# Patient Record
Sex: Male | Born: 1970 | Hispanic: Yes | Smoking: Never smoker
Health system: Southern US, Community
[De-identification: ages and names within clinical notes are randomized; demographics above are authoritative.]

---

## 2013-10-31 ENCOUNTER — Encounter (HOSPITAL_COMMUNITY): Admission: EM | Disposition: A | Discharge status: Home / Self Care | Payer: Self-pay | Source: Home / Self Care

## 2013-10-31 ENCOUNTER — Inpatient Hospital Stay (HOSPITAL_COMMUNITY): Payer: Worker's Compensation

## 2013-10-31 ENCOUNTER — Encounter (HOSPITAL_COMMUNITY): Payer: Worker's Compensation | Admitting: Anesthesiology

## 2013-10-31 ENCOUNTER — Emergency Department (HOSPITAL_COMMUNITY): Payer: Worker's Compensation

## 2013-10-31 ENCOUNTER — Encounter (HOSPITAL_COMMUNITY): Payer: Self-pay | Admitting: Emergency Medicine

## 2013-10-31 ENCOUNTER — Inpatient Hospital Stay (HOSPITAL_COMMUNITY): Payer: Worker's Compensation | Admitting: Anesthesiology

## 2013-10-31 ENCOUNTER — Inpatient Hospital Stay (HOSPITAL_COMMUNITY)
Admission: EM | Admit: 2013-10-31 | Discharge: 2013-11-06 | DRG: 957 | Disposition: A | Discharge status: Home / Self Care | Payer: Worker's Compensation | Attending: General Surgery | Admitting: General Surgery

## 2013-10-31 DIAGNOSIS — G960 Cerebrospinal fluid leak: Secondary | ICD-10-CM

## 2013-10-31 DIAGNOSIS — S0292XA Unspecified fracture of facial bones, initial encounter for closed fracture: Secondary | ICD-10-CM

## 2013-10-31 DIAGNOSIS — Y9269 Other specified industrial and construction area as the place of occurrence of the external cause: Secondary | ICD-10-CM

## 2013-10-31 DIAGNOSIS — D62 Acute posthemorrhagic anemia: Secondary | ICD-10-CM | POA: Diagnosis not present

## 2013-10-31 DIAGNOSIS — S62101B Fracture of unspecified carpal bone, right wrist, initial encounter for open fracture: Secondary | ICD-10-CM | POA: Diagnosis present

## 2013-10-31 DIAGNOSIS — S52122A Displaced fracture of head of left radius, initial encounter for closed fracture: Secondary | ICD-10-CM

## 2013-10-31 DIAGNOSIS — S52123A Displaced fracture of head of unspecified radius, initial encounter for closed fracture: Secondary | ICD-10-CM | POA: Diagnosis present

## 2013-10-31 DIAGNOSIS — S52599B Other fractures of lower end of unspecified radius, initial encounter for open fracture type I or II: Secondary | ICD-10-CM | POA: Diagnosis present

## 2013-10-31 DIAGNOSIS — S37019A Minor contusion of unspecified kidney, initial encounter: Secondary | ICD-10-CM | POA: Diagnosis present

## 2013-10-31 DIAGNOSIS — S42409A Unspecified fracture of lower end of unspecified humerus, initial encounter for closed fracture: Secondary | ICD-10-CM | POA: Diagnosis present

## 2013-10-31 DIAGNOSIS — S04019A Injury of optic nerve, unspecified eye, initial encounter: Secondary | ICD-10-CM | POA: Diagnosis present

## 2013-10-31 DIAGNOSIS — G9389 Other specified disorders of brain: Secondary | ICD-10-CM | POA: Diagnosis present

## 2013-10-31 DIAGNOSIS — J9600 Acute respiratory failure, unspecified whether with hypoxia or hypercapnia: Secondary | ICD-10-CM | POA: Diagnosis not present

## 2013-10-31 DIAGNOSIS — W1789XA Other fall from one level to another, initial encounter: Secondary | ICD-10-CM | POA: Diagnosis present

## 2013-10-31 DIAGNOSIS — G9601 Cranial cerebrospinal fluid leak, spontaneous: Secondary | ICD-10-CM | POA: Diagnosis present

## 2013-10-31 DIAGNOSIS — S62109A Fracture of unspecified carpal bone, unspecified wrist, initial encounter for closed fracture: Secondary | ICD-10-CM

## 2013-10-31 DIAGNOSIS — S0280XA Fracture of other specified skull and facial bones, unspecified side, initial encounter for closed fracture: Secondary | ICD-10-CM | POA: Diagnosis present

## 2013-10-31 DIAGNOSIS — S3690XA Unspecified injury of unspecified intra-abdominal organ, initial encounter: Secondary | ICD-10-CM

## 2013-10-31 DIAGNOSIS — R58 Hemorrhage, not elsewhere classified: Secondary | ICD-10-CM

## 2013-10-31 DIAGNOSIS — H532 Diplopia: Secondary | ICD-10-CM | POA: Diagnosis present

## 2013-10-31 DIAGNOSIS — R319 Hematuria, unspecified: Secondary | ICD-10-CM | POA: Diagnosis present

## 2013-10-31 DIAGNOSIS — S139XXA Sprain of joints and ligaments of unspecified parts of neck, initial encounter: Secondary | ICD-10-CM | POA: Diagnosis present

## 2013-10-31 DIAGNOSIS — S15009A Unspecified injury of unspecified carotid artery, initial encounter: Secondary | ICD-10-CM | POA: Diagnosis present

## 2013-10-31 DIAGNOSIS — S42402A Unspecified fracture of lower end of left humerus, initial encounter for closed fracture: Secondary | ICD-10-CM | POA: Diagnosis present

## 2013-10-31 DIAGNOSIS — S060X9A Concussion with loss of consciousness of unspecified duration, initial encounter: Secondary | ICD-10-CM | POA: Diagnosis present

## 2013-10-31 DIAGNOSIS — H5460 Unqualified visual loss, one eye, unspecified: Secondary | ICD-10-CM | POA: Diagnosis present

## 2013-10-31 DIAGNOSIS — IMO0002 Reserved for concepts with insufficient information to code with codable children: Secondary | ICD-10-CM

## 2013-10-31 DIAGNOSIS — W19XXXA Unspecified fall, initial encounter: Secondary | ICD-10-CM | POA: Diagnosis present

## 2013-10-31 DIAGNOSIS — I7771 Dissection of carotid artery: Secondary | ICD-10-CM | POA: Diagnosis present

## 2013-10-31 HISTORY — PX: ORIF WRIST FRACTURE: SHX2133

## 2013-10-31 LAB — CBC
HCT: 34.1 % — ABNORMAL LOW (ref 39.0–52.0)
Hemoglobin: 12.1 g/dL — ABNORMAL LOW (ref 13.0–17.0)
MCH: 32.8 pg (ref 26.0–34.0)
MCHC: 35.5 g/dL (ref 30.0–36.0)
MCV: 92.4 fL (ref 78.0–100.0)
Platelets: 225 10*3/uL (ref 150–400)
RBC: 3.69 MIL/uL — ABNORMAL LOW (ref 4.22–5.81)
RDW: 13.6 % (ref 11.5–15.5)
WBC: 17.2 10*3/uL — ABNORMAL HIGH (ref 4.0–10.5)

## 2013-10-31 LAB — URINALYSIS, ROUTINE W REFLEX MICROSCOPIC
Bilirubin Urine: NEGATIVE
Glucose, UA: 100 mg/dL — AB
KETONES UR: 15 mg/dL — AB
Nitrite: NEGATIVE
PROTEIN: NEGATIVE mg/dL
Specific Gravity, Urine: 1.024 (ref 1.005–1.030)
Urobilinogen, UA: 0.2 mg/dL (ref 0.0–1.0)
pH: 6 (ref 5.0–8.0)

## 2013-10-31 LAB — POCT I-STAT, CHEM 8
BUN: 18 mg/dL (ref 6–23)
Calcium, Ion: 1.1 mmol/L — ABNORMAL LOW (ref 1.12–1.23)
Chloride: 106 mEq/L (ref 96–112)
Creatinine, Ser: 1 mg/dL (ref 0.50–1.35)
Glucose, Bld: 189 mg/dL — ABNORMAL HIGH (ref 70–99)
HCT: 45 % (ref 39.0–52.0)
Hemoglobin: 15.3 g/dL (ref 13.0–17.0)
Potassium: 3.2 mEq/L — ABNORMAL LOW (ref 3.7–5.3)
Sodium: 143 mEq/L (ref 137–147)
TCO2: 21 mmol/L (ref 0–100)

## 2013-10-31 LAB — COMPREHENSIVE METABOLIC PANEL
ALT: 50 U/L (ref 0–53)
AST: 39 U/L — ABNORMAL HIGH (ref 0–37)
Albumin: 3.9 g/dL (ref 3.5–5.2)
Alkaline Phosphatase: 80 U/L (ref 39–117)
BUN: 18 mg/dL (ref 6–23)
CO2: 20 mEq/L (ref 19–32)
Calcium: 8.7 mg/dL (ref 8.4–10.5)
Chloride: 102 mEq/L (ref 96–112)
Creatinine, Ser: 0.89 mg/dL (ref 0.50–1.35)
GFR calc Af Amer: >90 mL/min (ref >90)
GFR calc non Af Amer: >90 mL/min (ref >90)
Glucose, Bld: 190 mg/dL — ABNORMAL HIGH (ref 70–99)
Potassium: 3.5 mEq/L — ABNORMAL LOW (ref 3.7–5.3)
Sodium: 139 mEq/L (ref 137–147)
Total Bilirubin: 0.4 mg/dL (ref 0.3–1.2)
Total Protein: 7.1 g/dL (ref 6.0–8.3)

## 2013-10-31 LAB — CBC WITH DIFFERENTIAL/PLATELET
Basophils Absolute: 0 10*3/uL (ref 0.0–0.1)
Basophils Relative: 0 % (ref 0–1)
Eosinophils Absolute: 0.1 10*3/uL (ref 0.0–0.7)
Eosinophils Relative: 1 % (ref 0–5)
HCT: 41 % (ref 39.0–52.0)
Hemoglobin: 14.9 g/dL (ref 13.0–17.0)
Lymphocytes Relative: 38 % (ref 12–46)
Lymphs Abs: 5 10*3/uL — ABNORMAL HIGH (ref 0.7–4.0)
MCH: 32.6 pg (ref 26.0–34.0)
MCHC: 36.3 g/dL — ABNORMAL HIGH (ref 30.0–36.0)
MCV: 89.7 fL (ref 78.0–100.0)
Monocytes Absolute: 0.8 10*3/uL (ref 0.1–1.0)
Monocytes Relative: 6 % (ref 3–12)
Neutro Abs: 7.3 10*3/uL (ref 1.7–7.7)
Neutrophils Relative %: 55 % (ref 43–77)
Platelets: 264 10*3/uL (ref 150–400)
RBC: 4.57 MIL/uL (ref 4.22–5.81)
RDW: 12.9 % (ref 11.5–15.5)
WBC: 13.2 10*3/uL — ABNORMAL HIGH (ref 4.0–10.5)

## 2013-10-31 LAB — URINE MICROSCOPIC-ADD ON

## 2013-10-31 LAB — LIPASE, BLOOD: Lipase: 34 U/L (ref 11–59)

## 2013-10-31 LAB — CG4 I-STAT (LACTIC ACID): Lactic Acid, Venous: 2.97 mmol/L — ABNORMAL HIGH (ref 0.5–2.2)

## 2013-10-31 LAB — TYPE AND SCREEN
ABO/RH(D): O POS
Antibody Screen: NEGATIVE

## 2013-10-31 LAB — ABO/RH: ABO/RH(D): O POS

## 2013-10-31 LAB — AMYLASE: AMYLASE: 101 U/L (ref 0–105)

## 2013-10-31 LAB — MRSA PCR SCREENING: MRSA by PCR: NEGATIVE

## 2013-10-31 SURGERY — OPEN REDUCTION INTERNAL FIXATION (ORIF) WRIST FRACTURE
Anesthesia: General | Laterality: Right

## 2013-10-31 MED ORDER — FENTANYL CITRATE 0.05 MG/ML IJ SOLN
INTRAMUSCULAR | Status: DC | PRN
  Administered 2013-10-31 (×2): 100 ug via INTRAVENOUS
  Administered 2013-10-31: 50 ug via INTRAVENOUS

## 2013-10-31 MED ORDER — ONDANSETRON HCL 4 MG/2ML IJ SOLN
4.0000 mg | Freq: Four times a day (QID) | INTRAMUSCULAR | Status: DC | PRN
  Administered 2013-10-31 – 2013-11-01 (×2): 4 mg via INTRAVENOUS
  Filled 2013-10-31 (×2): qty 2

## 2013-10-31 MED ORDER — FENTANYL CITRATE 0.05 MG/ML IJ SOLN: 100.0000 ug | Freq: Once | INTRAMUSCULAR | Status: DC

## 2013-10-31 MED ORDER — ONDANSETRON HCL 4 MG/2ML IJ SOLN
4.0000 mg | Freq: Once | INTRAMUSCULAR | Status: AC
  Administered 2013-10-31: 4 mg via INTRAVENOUS

## 2013-10-31 MED ORDER — FENTANYL CITRATE 0.05 MG/ML IJ SOLN
INTRAMUSCULAR | Status: AC | PRN
  Administered 2013-10-31: 100 ug via INTRAVENOUS

## 2013-10-31 MED ORDER — PANTOPRAZOLE SODIUM 40 MG IV SOLR
40.0000 mg | Freq: Every day | INTRAVENOUS | Status: DC
  Filled 2013-10-31: qty 40

## 2013-10-31 MED ORDER — OXYCODONE HCL 5 MG PO TABS: 5.0000 mg | ORAL_TABLET | Freq: Once | ORAL | Status: DC | PRN

## 2013-10-31 MED ORDER — TETANUS-DIPHTH-ACELL PERTUSSIS 5-2.5-18.5 LF-MCG/0.5 IM SUSP
0.5000 mL | Freq: Once | INTRAMUSCULAR | Status: AC
  Administered 2013-10-31: 0.5 mL via INTRAMUSCULAR

## 2013-10-31 MED ORDER — CEFAZOLIN SODIUM 1-5 GM-% IV SOLN
1.0000 g | Freq: Once | INTRAVENOUS | Status: AC
  Administered 2013-10-31: 1 g via INTRAVENOUS

## 2013-10-31 MED ORDER — FENTANYL CITRATE 0.05 MG/ML IJ SOLN
INTRAMUSCULAR | Status: AC
  Filled 2013-10-31: qty 5

## 2013-10-31 MED ORDER — ONDANSETRON HCL 4 MG/2ML IJ SOLN: 4.0000 mg | Freq: Four times a day (QID) | INTRAMUSCULAR | Status: DC | PRN

## 2013-10-31 MED ORDER — MORPHINE SULFATE 4 MG/ML IJ SOLN: 6.0000 mg | INTRAMUSCULAR | Status: DC | PRN

## 2013-10-31 MED ORDER — BUPIVACAINE HCL 0.25 % IJ SOLN
INTRAMUSCULAR | Status: DC | PRN
  Administered 2013-10-31: 10 mL

## 2013-10-31 MED ORDER — PANTOPRAZOLE SODIUM 40 MG PO TBEC
40.0000 mg | DELAYED_RELEASE_TABLET | Freq: Every day | ORAL | Status: DC
  Administered 2013-11-01 – 2013-11-06 (×6): 40 mg via ORAL
  Filled 2013-10-31 (×6): qty 1

## 2013-10-31 MED ORDER — FENTANYL CITRATE 0.05 MG/ML IJ SOLN: 50.0000 ug | INTRAMUSCULAR | Status: DC | PRN

## 2013-10-31 MED ORDER — TETANUS-DIPHTH-ACELL PERTUSSIS 5-2.5-18.5 LF-MCG/0.5 IM SUSP
INTRAMUSCULAR | Status: AC
  Filled 2013-10-31: qty 0.5

## 2013-10-31 MED ORDER — HYDROMORPHONE HCL PF 1 MG/ML IJ SOLN: 0.2500 mg | INTRAMUSCULAR | Status: DC | PRN

## 2013-10-31 MED ORDER — CHLORHEXIDINE GLUCONATE CLOTH 2 % EX PADS
6.0000 | MEDICATED_PAD | Freq: Once | CUTANEOUS | Status: AC
  Administered 2013-10-31: 6 via TOPICAL

## 2013-10-31 MED ORDER — BUPIVACAINE HCL (PF) 0.25 % IJ SOLN
INTRAMUSCULAR | Status: AC
  Filled 2013-10-31: qty 30

## 2013-10-31 MED ORDER — LACTATED RINGERS IV SOLN
INTRAVENOUS | Status: DC | PRN
  Administered 2013-10-31 (×2): via INTRAVENOUS

## 2013-10-31 MED ORDER — CEFAZOLIN SODIUM-DEXTROSE 2-3 GM-% IV SOLR
2.0000 g | INTRAVENOUS | Status: DC
  Filled 2013-10-31: qty 50

## 2013-10-31 MED ORDER — SUCCINYLCHOLINE CHLORIDE 20 MG/ML IJ SOLN
INTRAMUSCULAR | Status: DC | PRN
  Administered 2013-10-31: 160 mg via INTRAVENOUS

## 2013-10-31 MED ORDER — METOCLOPRAMIDE HCL 5 MG PO TABS
5.0000 mg | ORAL_TABLET | Freq: Three times a day (TID) | ORAL | Status: DC | PRN
  Filled 2013-10-31: qty 2

## 2013-10-31 MED ORDER — ACETAMINOPHEN 500 MG PO TABS: 1000.0000 mg | ORAL_TABLET | Freq: Once | ORAL | Status: DC

## 2013-10-31 MED ORDER — ONDANSETRON HCL 4 MG PO TABS: 4.0000 mg | ORAL_TABLET | Freq: Four times a day (QID) | ORAL | Status: DC | PRN

## 2013-10-31 MED ORDER — CLINDAMYCIN PHOSPHATE 600 MG/50ML IV SOLN
600.0000 mg | Freq: Once | INTRAVENOUS | Status: AC
  Administered 2013-10-31: 600 mg via INTRAVENOUS
  Filled 2013-10-31: qty 50

## 2013-10-31 MED ORDER — CHLORHEXIDINE GLUCONATE 4 % EX LIQD
60.0000 mL | Freq: Once | CUTANEOUS | Status: DC
  Filled 2013-10-31: qty 60

## 2013-10-31 MED ORDER — PROPOFOL 10 MG/ML IV BOLUS
INTRAVENOUS | Status: DC | PRN
  Administered 2013-10-31: 200 mg via INTRAVENOUS

## 2013-10-31 MED ORDER — MIDAZOLAM HCL 5 MG/5ML IJ SOLN
INTRAMUSCULAR | Status: DC | PRN
  Administered 2013-10-31: 2 mg via INTRAVENOUS

## 2013-10-31 MED ORDER — CEFAZOLIN SODIUM-DEXTROSE 2-3 GM-% IV SOLR
2.0000 g | Freq: Four times a day (QID) | INTRAVENOUS | Status: AC
  Administered 2013-10-31 – 2013-11-01 (×3): 2 g via INTRAVENOUS
  Filled 2013-10-31 (×4): qty 50

## 2013-10-31 MED ORDER — METOCLOPRAMIDE HCL 5 MG/ML IJ SOLN
5.0000 mg | Freq: Three times a day (TID) | INTRAMUSCULAR | Status: DC | PRN
  Filled 2013-10-31: qty 2

## 2013-10-31 MED ORDER — DEXTROSE-NACL 5-0.45 % IV SOLN: 100.0000 mL/h | INTRAVENOUS | Status: DC

## 2013-10-31 MED ORDER — MIDAZOLAM HCL 2 MG/2ML IJ SOLN
INTRAMUSCULAR | Status: AC
  Filled 2013-10-31: qty 2

## 2013-10-31 MED ORDER — CHLORHEXIDINE GLUCONATE CLOTH 2 % EX PADS: 6.0000 | MEDICATED_PAD | Freq: Once | CUTANEOUS | Status: DC

## 2013-10-31 MED ORDER — ONDANSETRON HCL 4 MG/2ML IJ SOLN
INTRAMUSCULAR | Status: DC | PRN
  Administered 2013-10-31: 4 mg via INTRAVENOUS

## 2013-10-31 MED ORDER — ONDANSETRON HCL 4 MG/2ML IJ SOLN
INTRAMUSCULAR | Status: AC
  Administered 2013-10-31: 4 mg via INTRAVENOUS
  Filled 2013-10-31: qty 2

## 2013-10-31 MED ORDER — DEXTROSE 5 % IV SOLN
2.0000 g | Freq: Once | INTRAVENOUS | Status: AC
  Administered 2013-10-31: 2 g via INTRAVENOUS
  Filled 2013-10-31: qty 20

## 2013-10-31 MED ORDER — 0.9 % SODIUM CHLORIDE (POUR BTL) OPTIME
TOPICAL | Status: DC | PRN
  Administered 2013-10-31: 1000 mL

## 2013-10-31 MED ORDER — IOHEXOL 350 MG/ML SOLN
50.0000 mL | Freq: Once | INTRAVENOUS | Status: AC | PRN
  Administered 2013-10-31: 50 mL via INTRAVENOUS

## 2013-10-31 MED ORDER — SODIUM CHLORIDE 0.9 % IV BOLUS (SEPSIS)
1000.0000 mL | Freq: Once | INTRAVENOUS | Status: AC
  Administered 2013-10-31: 1000 mL via INTRAVENOUS

## 2013-10-31 MED ORDER — MORPHINE SULFATE 4 MG/ML IJ SOLN
4.0000 mg | INTRAMUSCULAR | Status: DC | PRN
  Administered 2013-10-31 – 2013-11-02 (×6): 4 mg via INTRAVENOUS
  Filled 2013-10-31 (×6): qty 1

## 2013-10-31 MED ORDER — OXYCODONE HCL 5 MG/5ML PO SOLN: 5.0000 mg | Freq: Once | ORAL | Status: DC | PRN

## 2013-10-31 MED ORDER — POTASSIUM CHLORIDE IN NACL 20-0.45 MEQ/L-% IV SOLN
INTRAVENOUS | Status: DC
  Administered 2013-10-31: 22:00:00 via INTRAVENOUS
  Administered 2013-11-01: 50 mL via INTRAVENOUS
  Administered 2013-11-02: 10:00:00 via INTRAVENOUS
  Filled 2013-10-31 (×5): qty 1000

## 2013-10-31 MED ORDER — FENTANYL CITRATE 0.05 MG/ML IJ SOLN
INTRAMUSCULAR | Status: AC
Start: 2013-10-31 — End: 2013-10-31
  Filled 2013-10-31: qty 2

## 2013-10-31 MED ORDER — LIDOCAINE HCL (CARDIAC) 20 MG/ML IV SOLN
INTRAVENOUS | Status: DC | PRN
  Administered 2013-10-31: 80 mg via INTRAVENOUS

## 2013-10-31 MED ORDER — MORPHINE SULFATE 2 MG/ML IJ SOLN
2.0000 mg | INTRAMUSCULAR | Status: DC | PRN
  Administered 2013-11-02 (×2): 2 mg via INTRAVENOUS
  Filled 2013-10-31 (×2): qty 1

## 2013-10-31 SURGICAL SUPPLY — 62 items
BANDAGE ELASTIC 3 VELCRO ST LF (GAUZE/BANDAGES/DRESSINGS) ×2 IMPLANT
BANDAGE ELASTIC 4 VELCRO ST LF (GAUZE/BANDAGES/DRESSINGS) ×2 IMPLANT
BANDAGE GAUZE ELAST BULKY 4 IN (GAUZE/BANDAGES/DRESSINGS) ×2 IMPLANT
BENZOIN TINCTURE PRP APPL 2/3 (GAUZE/BANDAGES/DRESSINGS) ×2 IMPLANT
BIT DRILL 2.2 SS TIBIAL (BIT) ×2 IMPLANT
BLADE SURG ROTATE 9660 (MISCELLANEOUS) IMPLANT
BNDG ESMARK 4X9 LF (GAUZE/BANDAGES/DRESSINGS) ×2 IMPLANT
CLOTH BEACON ORANGE TIMEOUT ST (SAFETY) ×2 IMPLANT
CLSR STERI-STRIP ANTIMIC 1/2X4 (GAUZE/BANDAGES/DRESSINGS) ×2 IMPLANT
CORDS BIPOLAR (ELECTRODE) ×2 IMPLANT
COVER SURGICAL LIGHT HANDLE (MISCELLANEOUS) ×2 IMPLANT
CUFF TOURNIQUET SINGLE 18IN (TOURNIQUET CUFF) ×2 IMPLANT
CUFF TOURNIQUET SINGLE 24IN (TOURNIQUET CUFF) IMPLANT
DECANTER SPIKE VIAL GLASS SM (MISCELLANEOUS) IMPLANT
DRAPE INCISE IOBAN 66X45 STRL (DRAPES) ×2 IMPLANT
DRAPE OEC MINIVIEW 54X84 (DRAPES) ×2 IMPLANT
DRAPE U-SHAPE 47X51 STRL (DRAPES) ×2 IMPLANT
ELECT REM PT RETURN 9FT ADLT (ELECTROSURGICAL)
ELECTRODE REM PT RTRN 9FT ADLT (ELECTROSURGICAL) IMPLANT
GAUZE XEROFORM 1X8 LF (GAUZE/BANDAGES/DRESSINGS) ×2 IMPLANT
GLOVE BIO SURGEON STRL SZ 6.5 (GLOVE) ×4 IMPLANT
GLOVE BIO SURGEON STRL SZ7.5 (GLOVE) ×2 IMPLANT
GLOVE BIOGEL PI IND STRL 6.5 (GLOVE) ×1 IMPLANT
GLOVE BIOGEL PI IND STRL 7.0 (GLOVE) ×3 IMPLANT
GLOVE BIOGEL PI IND STRL 8 (GLOVE) ×2 IMPLANT
GLOVE BIOGEL PI INDICATOR 6.5 (GLOVE) ×1
GLOVE BIOGEL PI INDICATOR 7.0 (GLOVE) ×3
GLOVE BIOGEL PI INDICATOR 8 (GLOVE) ×2
GLOVE BIOGEL PI ORTHO PRO SZ8 (GLOVE) ×1
GLOVE PI ORTHO PRO STRL SZ8 (GLOVE) ×1 IMPLANT
GOWN STRL NON-REIN LRG LVL3 (GOWN DISPOSABLE) ×2 IMPLANT
GOWN STRL REIN XL XLG (GOWN DISPOSABLE) ×4 IMPLANT
K-WIRE 1.6 (WIRE) ×2
K-WIRE FX5X1.6XNS BN SS (WIRE) ×2
KIT BASIN OR (CUSTOM PROCEDURE TRAY) ×2 IMPLANT
KIT ROOM TURNOVER OR (KITS) ×2 IMPLANT
KWIRE FX5X1.6XNS BN SS (WIRE) ×2 IMPLANT
MANIFOLD NEPTUNE II (INSTRUMENTS) IMPLANT
NEEDLE HYPO 25GX1X1/2 BEV (NEEDLE) IMPLANT
NS IRRIG 1000ML POUR BTL (IV SOLUTION) ×4 IMPLANT
PACK ORTHO EXTREMITY (CUSTOM PROCEDURE TRAY) ×2 IMPLANT
PAD ARMBOARD 7.5X6 YLW CONV (MISCELLANEOUS) ×4 IMPLANT
PAD CAST 4YDX4 CTTN HI CHSV (CAST SUPPLIES) ×1 IMPLANT
PADDING CAST COTTON 4X4 STRL (CAST SUPPLIES) ×1
PADDING CAST COTTON 6X4 STRL (CAST SUPPLIES) ×2 IMPLANT
PEG LOCKING SMOOTH 2.2X18 (Peg) ×6 IMPLANT
PEG LOCKING SMOOTH 2.2X20 (Screw) ×8 IMPLANT
PLATE LONG DVR RIGHT (Plate) ×2 IMPLANT
SCREW  LP NL 2.7X16MM (Screw) ×3 IMPLANT
SCREW LP NL 2.7X16MM (Screw) ×3 IMPLANT
SPONGE GAUZE 4X4 12PLY (GAUZE/BANDAGES/DRESSINGS) ×2 IMPLANT
SPONGE LAP 4X18 X RAY DECT (DISPOSABLE) ×2 IMPLANT
STRIP CLOSURE SKIN 1/2X4 (GAUZE/BANDAGES/DRESSINGS) ×2 IMPLANT
SUT MNCRL AB 4-0 PS2 18 (SUTURE) ×2 IMPLANT
SYR CONTROL 10ML LL (SYRINGE) IMPLANT
TOWEL OR 17X24 6PK STRL BLUE (TOWEL DISPOSABLE) ×2 IMPLANT
TOWEL OR 17X26 10 PK STRL BLUE (TOWEL DISPOSABLE) ×2 IMPLANT
TOWEL OR NON WOVEN STRL DISP B (DISPOSABLE) ×2 IMPLANT
TRAY FOLEY CATH 16FRSI W/METER (SET/KITS/TRAYS/PACK) ×2 IMPLANT
TUBE CONNECTING 12X1/4 (SUCTIONS) ×2 IMPLANT
WATER STERILE IRR 1000ML POUR (IV SOLUTION) ×4 IMPLANT
YANKAUER SUCT BULB TIP NO VENT (SUCTIONS) IMPLANT

## 2013-10-31 NOTE — Preoperative (Signed)
Beta Blockers   Reason not to administer Beta Blockers:Not Applicable 

## 2013-10-31 NOTE — Consult Note (Signed)
CC:  Chief Complaint  Patient presents with  . Fall    HPI: Tommy Lee is a 43 y.o. male brought into the emergency department by EMS after falling from a proximally 2 stories.  The patient is currently complaining of pain in his face, as well as his right arm and his chest.   The patient is a Spanish-speaking and history is therefore somewhat limited due to the language barrier.  PMH: History reviewed. No pertinent past medical history.  PSH: History reviewed. No pertinent past surgical history.  SH: History  Substance Use Topics  . Smoking status: Not on file  . Smokeless tobacco: Not on file  . Alcohol Use: Not on file    MEDS: Prior to Admission medications   Not on File    ALLERGY: No Known Allergies  NEUROLOGIC EXAM: Awake, alert, oriented to name, hospital, year, age Speech fluent CN grossly intact x decreased vision OS, unable to count fingers at 2 feet. Pupils reactive Motor exam: Upper Extremities Deltoid Bicep Tricep Grip  Right 5/5 5/5 5/5 5/5  Left 5/5 5/5 5/5 5/5   Lower Extremity IP Quad PF DF EHL  Right 5/5 5/5 5/5 5/5 5/5  Left 5/5 5/5 5/5 5/5 5/5   Sensation grossly intact to LT  IMGAING: CTH reviewed without ICH. No HCP.  Maxillofacial CT demonstrates multiple facial fractures including left ethmoids/sphenoid sinus fractures and fracture through left frontal sinus ant and pos tables with underlying pneumocephalus. Sphenoid fracture also appears to cause obscuration of the left optic canal  IMPRESSION: - 43 y.o. male s/p fall with multiple facial fractures, neurologically intact except left eye vision  PLAN: - Trauma admit - Close neurologic observation, observe for CSF leak.

## 2013-10-31 NOTE — Anesthesia Postprocedure Evaluation (Signed)
  Anesthesia Post-op Note  Patient: Tommy Lee  Procedure(s) Performed: Procedure(s): OPEN REDUCTION INTERNAL FIXATION (ORIF) WRIST FRACTURE (Right)  Patient Location: PACU  Anesthesia Type:General  Level of Consciousness: awake, alert  and oriented  Airway and Oxygen Therapy: Patient Spontanous Breathing and Patient connected to nasal cannula oxygen  Post-op Pain: mild  Post-op Assessment: Post-op Vital signs reviewed  Post-op Vital Signs: Reviewed  Complications: No apparent anesthesia complications

## 2013-10-31 NOTE — Anesthesia Preprocedure Evaluation (Signed)
Anesthesia Evaluation  Patient identified by MRN, date of birth, ID band Patient awake    Reviewed: Allergy & Precautions, H&P , NPO status , Patient's Chart, lab work & pertinent test results  Airway Mallampati: II    Mouth opening: Limited Mouth Opening Comment: C-collar in place.   Dental   Pulmonary neg pulmonary ROS,          Cardiovascular negative cardio ROS      Neuro/Psych    GI/Hepatic   Endo/Other    Renal/GU      Musculoskeletal   Abdominal   Peds  Hematology   Anesthesia Other Findings   Reproductive/Obstetrics                           Anesthesia Physical Anesthesia Plan  ASA: I  Anesthesia Plan: General   Post-op Pain Management:    Induction: Intravenous  Airway Management Planned: Oral ETT  Additional Equipment:   Intra-op Plan:   Post-operative Plan: Extubation in OR  Informed Consent: I have reviewed the patients History and Physical, chart, labs and discussed the procedure including the risks, benefits and alternatives for the proposed anesthesia with the patient or authorized representative who has indicated his/her understanding and acceptance.     Plan Discussed with: CRNA, Anesthesiologist and Surgeon  Anesthesia Plan Comments:         Anesthesia Quick Evaluation

## 2013-10-31 NOTE — ED Notes (Signed)
Pt transported to CT with RN and monitor.  °

## 2013-10-31 NOTE — ED Notes (Signed)
Family updated as to patient's status.

## 2013-10-31 NOTE — ED Notes (Signed)
Family at beside. Family given emotional support. 

## 2013-10-31 NOTE — Progress Notes (Signed)
Orthopedic Tech Progress Note Patient Details:  Tommy Lee 01/15/1971 [id number]  Ortho Devices Type of Ortho Device: Volar splint Ortho Device/Splint Interventions: Application   Cammer, Mickie BailJennifer Carol 10/31/2013, 12:13 PM

## 2013-10-31 NOTE — ED Notes (Signed)
Transported to OR report given to Bridgett per Diona FoleyMorgan Brown RN

## 2013-10-31 NOTE — ED Notes (Signed)
Results of lactic acid given to Dr. Kohut 

## 2013-10-31 NOTE — Progress Notes (Signed)
Pt. was brought to ED by car after falling 2 story to concrete. Pt. has broken bones other injuries and possibly going to surgery. No family with pt.  Friend from work site at bedside. Pt. speaks no english.  Nurses got interpreter involved.  Will follow as needed.  10/31/13 1200  Clinical Encounter Type  Visited With Patient;Health care provider  Visit Type ED;Trauma  Referral From (Staff support)  Spiritual Encounters  Spiritual Needs Emotional  Stress Factors  Patient Stress Factors Exhausted  Venida JarvisWatlington, Elzena Muston, [place] ,[phone]

## 2013-10-31 NOTE — ED Notes (Signed)
X-ray at bedside

## 2013-10-31 NOTE — ED Notes (Signed)
Clothing cut and discarded, underwear in bag at bedside, pt had episode of approx 500 ml of blood noted vomit.

## 2013-10-31 NOTE — Op Note (Addendum)
10/31/2013  5:14 PM  PATIENT:  Tommy Lee    PRE-OPERATIVE DIAGNOSIS:   distal radius fracture  POST-OPERATIVE DIAGNOSIS:  Same  PROCEDURE:  OPEN REDUCTION INTERNAL FIXATION (ORIF) WRIST FRACTURE  SURGEON:  Sheral ApleyMURPHY, Aven Cegielski, D, MD  ASSISTANT: Janace LittenBrandon Parry OPA  ANESTHESIA:   Gen  PREOPERATIVE INDICATIONS:  Tommy Lee is a  43 y.o. male with a diagnosis of  distal radius fracture who failed conservative measures and elected for surgical management.    The risks benefits and alternatives were discussed with the patient preoperatively including but not limited to the risks of infection, bleeding, nerve injury, cardiopulmonary complications, the need for revision surgery, among others, and the patient was willing to proceed.  OPERATIVE IMPLANTS: DVR plate  OPERATIVE FINDINGS: Severe comminution in the joint and metaphysis of the distal radius  BLOOD LOSS: min  COMPLICATIONS: none  TOURNIQUET TIME: 70min  OPERATIVE PROCEDURE:  Patient was identified in the preoperative holding area and site was marked by me He was transported to the operating theater and placed on the table in supine position taking care to pad all bony prominences. After a preincinduction time out anesthesia was induced. The right upper extremity was prepped and draped in normal sterile fashion and a pre-incision timeout was performed. He received ancef for preoperative antibiotics.   The right upper extremity was exsanguinated and tourniquet was inflated to 250 mm mercury for roughly 70 minutes. I then made a volar approach of Sherilyn CooterHenry to the distal radius at taking care to protect the radial nerve and artery on through the FCR tendon sheath. Immediately noted severe comminution of the distal radius that extended into the distal diaphysis. This foot is very concerned about the ability to obtain articular reduction. I had to open up the volar piece and noted severe comminution of the dorsal cortex I then performed a  reduction of the shaft comminution and had a small portion it keyed in I put a K wire on this and then selected a long DVR plate fixed it to the shaft initially leaving the distal bit to increase her volar tilt we put it down. I took multiple fluoroscopic views and was actually happy with were able to obtain in the distal radius in terms articular reduction. There was still a small amount diastases between the ulnar and radial large pieces on the dorsal side however there was no step off side elected to maintain alignment as we had excellent radial length and radial inclination with volar tilt just past neutral which shows very happy with. I felt that to adjust this would risk losing all these other variables. I then fine tuned it to the distal portion and placed smooth pegs starting from ulnar and working my way radial. After placing all the pegs I placed 2 more shaft screws and tightened down the shaft of the plate to the vastus of the radius a bridging the comminution within the metaphyseal portion. This increased her volar tilt and the final fluoroscopic views demonstrated good alignment and stable placement of all hardware. The plate was just slightly proximal on the radial side and just the very proximal portion of it was treated off of the ulnar side of the radius however given his level of comminution and a reduction I felt that this was a perfectly acceptable alignment of the plate as very happy with articular reduction and alignment of his wrist. I then performed a thorough irrigation of the open portion of his wound his styloid debrided a small  amount of bone and skin and placed a liter of fluid from inside out. I then left this small poke hole open for drainage and closed the surgical incision and layer fashion with absorbable suture. I stress tested his DRUJ and it was stable His placed in a volar splint after a sterile dressing.  I then performed an exam and manupulation of the Left elbow. There was  no crepitous with ROM at the elbow and his pronation/supination was full with no instability at the elbow joint.   POST OPERATIVE PLAN: Nonweightbearing of his right wrist continue Ancef for 24 hours DVT prophylaxis per the trauma team.

## 2013-10-31 NOTE — ED Notes (Signed)
MD speaking with radioolgy regarding ct scans, Dr Juleen ChinaKohut aware of heart rate

## 2013-10-31 NOTE — Transfer of Care (Signed)
Immediate Anesthesia Transfer of Care Note  Patient: Tommy Lee  Procedure(s) Performed: Procedure(s): OPEN REDUCTION INTERNAL FIXATION (ORIF) WRIST FRACTURE (Right)  Patient Location: PACU  Anesthesia Type:General  Level of Consciousness: awake and alert   Airway & Oxygen Therapy: Patient Spontanous Breathing and Patient connected to nasal cannula oxygen  Post-op Assessment: Report given to PACU RN, Post -op Vital signs reviewed and stable and Patient moving all extremities X 4  Post vital signs: Reviewed and stable  Complications: No apparent anesthesia complications

## 2013-10-31 NOTE — ED Notes (Signed)
Urine sample collect and placed at the bedside.

## 2013-10-31 NOTE — ED Notes (Signed)
Xray at bedside, for portables.

## 2013-10-31 NOTE — ED Notes (Signed)
Md made aware that 2nd time pt voided in urinal, urine noted to have clots in it and red in color, iniital urine obtained was clear yellow

## 2013-10-31 NOTE — H&P (Signed)
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 cleared to go to the operating room with Dr. Percell Miller from orthopedics for ORIF right upper extremity. Emergency department physician has contacted Dr.Nundkumar from neurosurgery to consult. We will also consult facial trauma regarding his facial fractures. He has received IV antibiotics. I spoke to the patient in detail in Spanish regarding the plan of care.  Auria Mckinlay E 10/31/2013, 2:48 PM

## 2013-10-31 NOTE — Progress Notes (Signed)
Patient ID: Tommy Lee, male   DOB: 1971-02-07, 43 y.o.   MRN: [id number]  Patient extubated post op but had decreased mental status in the PACU.  I planned to order a CT of the head but it actually got put in as a CTA.  I notified neurosurg of the results showing a subtle supraclinoid carotid dissection. Currently, the patient is now awake and alert and moves his bilateral upper and lower extremities normally.

## 2013-10-31 NOTE — ED Notes (Signed)
Per visitor with patient, pt, fell 20 ft, (2 stories) from roof, pt complains of pain everywhere, pt able to move all 4 extremities, facial trauma noted.

## 2013-10-31 NOTE — Anesthesia Procedure Notes (Signed)
Procedure Name: Intubation Date/Time: 10/31/2013 3:43 PM Performed by: Whitman HeroVITSHTEYN, Aevah Stansbery Pre-anesthesia Checklist: Patient identified, Emergency Drugs available, Suction available and Patient being monitored Patient Re-evaluated:Patient Re-evaluated prior to inductionOxygen Delivery Method: Circle system utilized Preoxygenation: Pre-oxygenation with 100% oxygen Intubation Type: IV induction, Cricoid Pressure applied and Rapid sequence Laryngoscope Size: Mac and 3 Grade View: Grade II Tube type: Oral Tube size: 7.5 mm Number of attempts: 1 Airway Equipment and Method: Stylet and Video-laryngoscopy Placement Confirmation: ETT inserted through vocal cords under direct vision,  positive ETCO2 and breath sounds checked- equal and bilateral Secured at: 22 cm Tube secured with: Tape Dental Injury: Teeth and Oropharynx as per pre-operative assessment  Difficulty Due To: Difficulty was anticipated, Difficult Airway-  due to neck instability and Difficult Airway- due to cervical collar
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
## 2013-10-31 NOTE — Consult Note (Addendum)
ORTHOPAEDIC CONSULTATION  REQUESTING PHYSICIAN: Virgel Manifold, MD  Chief Complaint: Right wrist fracture  HPI: Tommy Lee is a 43 y.o. male who complains of  S/p fall from height  History reviewed. No pertinent past medical history. History reviewed. No pertinent past surgical history. History   Social History  . Marital Status: Legally Separated    Spouse Name: N/A    Number of Children: N/A  . Years of Education: N/A   Social History Main Topics  . Smoking status: None  . Smokeless tobacco: None  . Alcohol Use: None  . Drug Use: None  . Sexual Activity: None   Other Topics Concern  . None   Social History Narrative  . None   History reviewed. No pertinent family history. No Known Allergies Prior to Admission medications   Not on File   Ct Head Wo Contrast  10/31/2013   CLINICAL DATA:  Fall.  Facial injuries.  EXAM: CT HEAD WITHOUT CONTRAST  CT MAXILLOFACIAL WITHOUT CONTRAST  CT CERVICAL SPINE WITHOUT CONTRAST  TECHNIQUE: Multidetector CT imaging of the head, cervical spine, and maxillofacial structures were performed using the standard protocol without intravenous contrast. Multiplanar CT image reconstructions of the cervical spine and maxillofacial structures were also generated.  COMPARISON:  None.  FINDINGS: CT HEAD FINDINGS  There is a comminuted fracture of the left frontal sinus involving anterior and posterior walls. There is underlying pneumocephalus. No intracranial hemorrhage is identified. There is no evidence of acute infarct, mass, or midline shift. No extra-axial fluid collection is seen. Cerebral volume is normal for age. There is a slightly depressed fracture of the left inferolateral frontal bone. Additional maxillofacial fractures and paranasal sinus opacification is more fully evaluated on maxillofacial CT.  CT MAXILLOFACIAL FINDINGS  Left greater than right periorbital hematomas are present. There is a fracture involving the anterior and posterior  walls of the left frontal sinus. The posterior wall is comminuted. There is underlying pneumocephalus. The fracture extends through the superior and the lateral walls of the left orbit. There is a comminuted fragment in the left frontal bone superior and lateral to the left orbit which demonstrates approximately 2 mm of depression (series 5, image 84). The fracture also involves the medial wall of the left orbit/ lamina papyracea. There is also fracture of the body of the sphenoid involving the walls of the left sphenoid sinus. There is a minimally displaced fracture of the left zygoma. There are fractures through the medial and lateral walls of the right orbit. No retrobulbar hematoma is seen. There is also a minimally displaced fracture of the anterior floor of the right orbit and anterior wall of the right maxillary sinus. There is a fracture of the left nasal bone.  Blood is present with in the left greater than right frontal sinuses, bilateral ethmoid air cells, left sphenoid sinus, and bilateral maxillary sinuses. There is comminuted fracture involving the left cribriform plate and planum sphenoidale.  CT CERVICAL SPINE FINDINGS  Vertebral alignment is normal. Prevertebral soft tissues are unremarkable. No cervical spine fracture is identified. Mild endplate spurring is present at C5-6 and C6-7. The paraspinal soft tissues are unremarkable.  IMPRESSION: 1. Extensive maxillofacial fractures as above, including comminuted fractures of the left frontal bone involving the frontal sinus as well as ethmoid and sphenoid bones disrupting the floor of the anterior cranial fossa. These are open fractures, and there is associated pneumocephalus. Fracture of the inferolateral left frontal bone is mildly depressed. 2. No intracranial hemorrhage.  3. No cervical spine fracture identified.  These results were called by telephone at the time of interpretation on 10/31/2013 at 1:11 PM to Dr. Virgel Manifold , who verbally  acknowledged these results.   Electronically Signed   By: Logan Bores   On: 10/31/2013 13:40   Ct Chest W Contrast  10/31/2013   CLINICAL DATA:  Fall from 20 feet.  EXAM: CT CHEST, ABDOMEN, AND PELVIS WITH CONTRAST  TECHNIQUE: Multidetector CT imaging of the chest, abdomen and pelvis was performed following the standard protocol during bolus administration of intravenous contrast.  CONTRAST:  80 cc Omnipaque 300 IV.  COMPARISON:  None.  FINDINGS: CT CHEST FINDINGS  Dependent ground-glass opacities in the lungs compatible with dependent atelectasis. No pleural effusions or pneumothorax. Prominent pleural/extrapleural fat noted mimicking pleural effusions. Heart is normal size. Aorta is normal caliber. No mediastinal, hilar, or axillary adenopathy. No evidence of mediastinal hematoma. Chest wall soft tissues are unremarkable. No acute bony abnormality.  CT ABDOMEN AND PELVIS FINDINGS  There is abnormal stranding noted in the left upper quadrant around the pancreatic tail and along the greater curvature of the stomach. Stranding continues anterior to the stomach and spleen. There is free fluid adjacent to the spleen without visible splenic laceration. It is unclear the exact location/source of the injury and fluid. This could represent a gastric injury along the greater curvature, pancreatic tail injury, occult splenic injury, mesenteric/omental injury, or a combination of the above. There appears to be a component of intraperitoneal and retroperitoneal fluid/stranding. Recommend close clinical surveillance and surgical consultation for possible exploration.  Liver, spleen, adrenals and kidneys are unremarkable. Gallbladder unremarkable. Large and small bowel unremarkable. Aorta is normal caliber.  Urinary bladder and prostate grossly unremarkable. No acute bony abnormality.  IMPRESSION: Fluid in stranding in the left upper quadrant which appears to be post intraperitoneal and retroperitoneal. It is difficult to  confirm the exact source, but possibilities include mesenteric/omental injury, gastric Injury (no free air noted), pancreatic tail injury. There is a small amount of fluid around the spleen without visible splenic laceration at this time. Occult splenic injury cannot be excluded. Recommend close clinical surveillance and surgical evaluation.  No acute findings in the chest.  Critical Value/emergent results were called by telephone at the time of interpretation on 10/31/2013 at 1:26 PM to Dr. Virgel Manifold , who verbally acknowledged these results.   Electronically Signed   By: Rolm Baptise M.D.   On: 10/31/2013 13:37   Ct Cervical Spine Wo Contrast  10/31/2013   CLINICAL DATA:  Fall.  Facial injuries.  EXAM: CT HEAD WITHOUT CONTRAST  CT MAXILLOFACIAL WITHOUT CONTRAST  CT CERVICAL SPINE WITHOUT CONTRAST  TECHNIQUE: Multidetector CT imaging of the head, cervical spine, and maxillofacial structures were performed using the standard protocol without intravenous contrast. Multiplanar CT image reconstructions of the cervical spine and maxillofacial structures were also generated.  COMPARISON:  None.  FINDINGS: CT HEAD FINDINGS  There is a comminuted fracture of the left frontal sinus involving anterior and posterior walls. There is underlying pneumocephalus. No intracranial hemorrhage is identified. There is no evidence of acute infarct, mass, or midline shift. No extra-axial fluid collection is seen. Cerebral volume is normal for age. There is a slightly depressed fracture of the left inferolateral frontal bone. Additional maxillofacial fractures and paranasal sinus opacification is more fully evaluated on maxillofacial CT.  CT MAXILLOFACIAL FINDINGS  Left greater than right periorbital hematomas are present. There is a fracture involving the anterior and posterior  walls of the left frontal sinus. The posterior wall is comminuted. There is underlying pneumocephalus. The fracture extends through the superior and the  lateral walls of the left orbit. There is a comminuted fragment in the left frontal bone superior and lateral to the left orbit which demonstrates approximately 2 mm of depression (series 5, image 84). The fracture also involves the medial wall of the left orbit/ lamina papyracea. There is also fracture of the body of the sphenoid involving the walls of the left sphenoid sinus. There is a minimally displaced fracture of the left zygoma. There are fractures through the medial and lateral walls of the right orbit. No retrobulbar hematoma is seen. There is also a minimally displaced fracture of the anterior floor of the right orbit and anterior wall of the right maxillary sinus. There is a fracture of the left nasal bone.  Blood is present with in the left greater than right frontal sinuses, bilateral ethmoid air cells, left sphenoid sinus, and bilateral maxillary sinuses. There is comminuted fracture involving the left cribriform plate and planum sphenoidale.  CT CERVICAL SPINE FINDINGS  Vertebral alignment is normal. Prevertebral soft tissues are unremarkable. No cervical spine fracture is identified. Mild endplate spurring is present at C5-6 and C6-7. The paraspinal soft tissues are unremarkable.  IMPRESSION: 1. Extensive maxillofacial fractures as above, including comminuted fractures of the left frontal bone involving the frontal sinus as well as ethmoid and sphenoid bones disrupting the floor of the anterior cranial fossa. These are open fractures, and there is associated pneumocephalus. Fracture of the inferolateral left frontal bone is mildly depressed. 2. No intracranial hemorrhage. 3. No cervical spine fracture identified.  These results were called by telephone at the time of interpretation on 10/31/2013 at 1:11 PM to Dr. Virgel Manifold , who verbally acknowledged these results.   Electronically Signed   By: Logan Bores   On: 10/31/2013 13:40   Ct Abdomen Pelvis W Contrast  10/31/2013   CLINICAL DATA:  Fall  from 20 feet.  EXAM: CT CHEST, ABDOMEN, AND PELVIS WITH CONTRAST  TECHNIQUE: Multidetector CT imaging of the chest, abdomen and pelvis was performed following the standard protocol during bolus administration of intravenous contrast.  CONTRAST:  80 cc Omnipaque 300 IV.  COMPARISON:  None.  FINDINGS: CT CHEST FINDINGS  Dependent ground-glass opacities in the lungs compatible with dependent atelectasis. No pleural effusions or pneumothorax. Prominent pleural/extrapleural fat noted mimicking pleural effusions. Heart is normal size. Aorta is normal caliber. No mediastinal, hilar, or axillary adenopathy. No evidence of mediastinal hematoma. Chest wall soft tissues are unremarkable. No acute bony abnormality.  CT ABDOMEN AND PELVIS FINDINGS  There is abnormal stranding noted in the left upper quadrant around the pancreatic tail and along the greater curvature of the stomach. Stranding continues anterior to the stomach and spleen. There is free fluid adjacent to the spleen without visible splenic laceration. It is unclear the exact location/source of the injury and fluid. This could represent a gastric injury along the greater curvature, pancreatic tail injury, occult splenic injury, mesenteric/omental injury, or a combination of the above. There appears to be a component of intraperitoneal and retroperitoneal fluid/stranding. Recommend close clinical surveillance and surgical consultation for possible exploration.  Liver, spleen, adrenals and kidneys are unremarkable. Gallbladder unremarkable. Large and small bowel unremarkable. Aorta is normal caliber.  Urinary bladder and prostate grossly unremarkable. No acute bony abnormality.  IMPRESSION: Fluid in stranding in the left upper quadrant which appears to be post intraperitoneal and retroperitoneal.  It is difficult to confirm the exact source, but possibilities include mesenteric/omental injury, gastric Injury (no free air noted), pancreatic tail injury. There is a small  amount of fluid around the spleen without visible splenic laceration at this time. Occult splenic injury cannot be excluded. Recommend close clinical surveillance and surgical evaluation.  No acute findings in the chest.  Critical Value/emergent results were called by telephone at the time of interpretation on 10/31/2013 at 1:26 PM to Dr. Virgel Manifold , who verbally acknowledged these results.   Electronically Signed   By: Rolm Baptise M.D.   On: 10/31/2013 13:37   Dg Pelvis Portable  10/31/2013   CLINICAL DATA:  Fall  EXAM: PORTABLE PELVIS 1-2 VIEWS  COMPARISON:  None.  FINDINGS: No acute fracture. No dislocation. Left pelvic phleboliths. Unremarkable soft tissues.  IMPRESSION: No acute bony pathology.   Electronically Signed   By: Maryclare Bean M.D.   On: 10/31/2013 12:34   Dg Chest Portable 1 View  10/31/2013   CLINICAL DATA:  Fall from roof with chest pain.  EXAM: PORTABLE CHEST - 1 VIEW  COMPARISON:  None.  FINDINGS: Lung volumes are low which accentuates mediastinal width. There is no evidence of pneumothorax, pulmonary consolidation, pleural fluid or visible fracture.  IMPRESSION: Mediastinal width is likely slightly accentuated by low lung volumes. No significant acute findings by portable chest x-ray.   Electronically Signed   By: Aletta Edouard M.D.   On: 10/31/2013 12:31   Ct Maxillofacial Wo Cm  10/31/2013   CLINICAL DATA:  Fall.  Facial injuries.  EXAM: CT HEAD WITHOUT CONTRAST  CT MAXILLOFACIAL WITHOUT CONTRAST  CT CERVICAL SPINE WITHOUT CONTRAST  TECHNIQUE: Multidetector CT imaging of the head, cervical spine, and maxillofacial structures were performed using the standard protocol without intravenous contrast. Multiplanar CT image reconstructions of the cervical spine and maxillofacial structures were also generated.  COMPARISON:  None.  FINDINGS: CT HEAD FINDINGS  There is a comminuted fracture of the left frontal sinus involving anterior and posterior walls. There is underlying pneumocephalus. No  intracranial hemorrhage is identified. There is no evidence of acute infarct, mass, or midline shift. No extra-axial fluid collection is seen. Cerebral volume is normal for age. There is a slightly depressed fracture of the left inferolateral frontal bone. Additional maxillofacial fractures and paranasal sinus opacification is more fully evaluated on maxillofacial CT.  CT MAXILLOFACIAL FINDINGS  Left greater than right periorbital hematomas are present. There is a fracture involving the anterior and posterior walls of the left frontal sinus. The posterior wall is comminuted. There is underlying pneumocephalus. The fracture extends through the superior and the lateral walls of the left orbit. There is a comminuted fragment in the left frontal bone superior and lateral to the left orbit which demonstrates approximately 2 mm of depression (series 5, image 84). The fracture also involves the medial wall of the left orbit/ lamina papyracea. There is also fracture of the body of the sphenoid involving the walls of the left sphenoid sinus. There is a minimally displaced fracture of the left zygoma. There are fractures through the medial and lateral walls of the right orbit. No retrobulbar hematoma is seen. There is also a minimally displaced fracture of the anterior floor of the right orbit and anterior wall of the right maxillary sinus. There is a fracture of the left nasal bone.  Blood is present with in the left greater than right frontal sinuses, bilateral ethmoid air cells, left sphenoid sinus, and bilateral maxillary sinuses.  There is comminuted fracture involving the left cribriform plate and planum sphenoidale.  CT CERVICAL SPINE FINDINGS  Vertebral alignment is normal. Prevertebral soft tissues are unremarkable. No cervical spine fracture is identified. Mild endplate spurring is present at C5-6 and C6-7. The paraspinal soft tissues are unremarkable.  IMPRESSION: 1. Extensive maxillofacial fractures as above,  including comminuted fractures of the left frontal bone involving the frontal sinus as well as ethmoid and sphenoid bones disrupting the floor of the anterior cranial fossa. These are open fractures, and there is associated pneumocephalus. Fracture of the inferolateral left frontal bone is mildly depressed. 2. No intracranial hemorrhage. 3. No cervical spine fracture identified.  These results were called by telephone at the time of interpretation on 10/31/2013 at 1:11 PM to Dr. Virgel Manifold , who verbally acknowledged these results.   Electronically Signed   By: Logan Bores   On: 10/31/2013 13:40    Positive ROS: All other systems have been reviewed and were otherwise negative with the exception of those mentioned in the HPI and as above.  Labs cbc  Recent Labs  10/31/13 1150 10/31/13 1201  WBC 13.2*  --   HGB 14.9 15.3  HCT 41.0 45.0  PLT 264  --     Labs inflam No results found for this basename: ESR, CRP,  in the last 72 hours  Labs coag No results found for this basename: INR, PT, PTT,  in the last 72 hours   Recent Labs  10/31/13 1150 10/31/13 1201  NA 139 143  K 3.5* 3.2*  CL 102 106  CO2 20  --   GLUCOSE 190* 189*  BUN 18 18  CREATININE 0.89 1.00  CALCIUM 8.7  --     Physical Exam: Filed Vitals:   10/31/13 1330  BP: 105/70  Pulse: 54  Temp:   Resp: 18   General: Alert, no acute distress Cardiovascular: No pedal edema Respiratory: No cyanosis, no use of accessory musculature GI: No organomegaly, abdomen is soft and non-tender Skin: No lesions in the area of chief complaint Neurologic: Sensation intact distally Psychiatric: Patient is competent for consent with normal mood and affect Lymphatic: No axillary or cervical lymphadenopathy  MUSCULOSKELETAL:  RUE: SILT M/R/U nerve, 2+ radial pulse, +EPL/FPL/IO Compartments soft Pai with ROM  LUE: some tenderness over proximal ulna, NVI  Other extremities are atraumatic with painless ROM and  NVI.  Assessment: R wrist fracture, Left radial head fracture  Plan: R wrist: Plan for ORIF and ID of fracture L radial head: Eval with CT for displacement, will decide non-op vs ORIF after CT Weight Bearing Status: NWB RUE Sling for LUE for now PT VTE px: SCD's and chemical per the trauma service, there is no orthopedic contraindication to chemical px post op.    Edmonia Lynch, D, MD Cell [phone]   10/31/2013 2:09 PM

## 2013-11-01 ENCOUNTER — Inpatient Hospital Stay (HOSPITAL_COMMUNITY): Payer: Worker's Compensation

## 2013-11-01 DIAGNOSIS — S37019A Minor contusion of unspecified kidney, initial encounter: Secondary | ICD-10-CM | POA: Diagnosis present

## 2013-11-01 DIAGNOSIS — S15009A Unspecified injury of unspecified carotid artery, initial encounter: Secondary | ICD-10-CM | POA: Diagnosis present

## 2013-11-01 DIAGNOSIS — D62 Acute posthemorrhagic anemia: Secondary | ICD-10-CM | POA: Diagnosis not present

## 2013-11-01 DIAGNOSIS — G9389 Other specified disorders of brain: Secondary | ICD-10-CM

## 2013-11-01 DIAGNOSIS — S42402A Unspecified fracture of lower end of left humerus, initial encounter for closed fracture: Secondary | ICD-10-CM | POA: Diagnosis present

## 2013-11-01 DIAGNOSIS — S060X9A Concussion with loss of consciousness of unspecified duration, initial encounter: Secondary | ICD-10-CM | POA: Diagnosis present

## 2013-11-01 DIAGNOSIS — W19XXXA Unspecified fall, initial encounter: Secondary | ICD-10-CM | POA: Diagnosis present

## 2013-11-01 DIAGNOSIS — S62101B Fracture of unspecified carpal bone, right wrist, initial encounter for open fracture: Secondary | ICD-10-CM | POA: Diagnosis present

## 2013-11-01 DIAGNOSIS — J9600 Acute respiratory failure, unspecified whether with hypoxia or hypercapnia: Secondary | ICD-10-CM | POA: Diagnosis not present

## 2013-11-01 LAB — CBC
HCT: 30.6 % — ABNORMAL LOW (ref 39.0–52.0)
Hemoglobin: 10.6 g/dL — ABNORMAL LOW (ref 13.0–17.0)
MCH: 31.5 pg (ref 26.0–34.0)
MCHC: 34.6 g/dL (ref 30.0–36.0)
MCV: 91.1 fL (ref 78.0–100.0)
PLATELETS: 232 10*3/uL (ref 150–400)
RBC: 3.36 MIL/uL — AB (ref 4.22–5.81)
RDW: 13.7 % (ref 11.5–15.5)
WBC: 14.4 10*3/uL — ABNORMAL HIGH (ref 4.0–10.5)

## 2013-11-01 LAB — BASIC METABOLIC PANEL
BUN: 20 mg/dL (ref 6–23)
CO2: 20 mEq/L (ref 19–32)
CREATININE: 1.01 mg/dL (ref 0.50–1.35)
Calcium: 7.5 mg/dL — ABNORMAL LOW (ref 8.4–10.5)
Chloride: 104 mEq/L (ref 96–112)
GFR calc non Af Amer: >90 mL/min (ref >90)
Glucose, Bld: 219 mg/dL — ABNORMAL HIGH (ref 70–99)
Potassium: 4.3 mEq/L (ref 3.7–5.3)
Sodium: 141 mEq/L (ref 137–147)

## 2013-11-01 MED ORDER — METOPROLOL TARTRATE 1 MG/ML IV SOLN
5.0000 mg | Freq: Four times a day (QID) | INTRAVENOUS | Status: DC
  Administered 2013-11-01 – 2013-11-03 (×7): 5 mg via INTRAVENOUS
  Filled 2013-11-01 (×12): qty 5

## 2013-11-01 MED ORDER — ASPIRIN EC 325 MG PO TBEC
325.0000 mg | DELAYED_RELEASE_TABLET | Freq: Every day | ORAL | Status: DC
  Administered 2013-11-01 – 2013-11-06 (×6): 325 mg via ORAL
  Filled 2013-11-01 (×7): qty 1

## 2013-11-01 NOTE — Evaluation (Signed)
Physical Therapy Evaluation Patient Details Name: Tommy Lee MRN: [id number] DOB: 10/31/1970 Today's Date: 11/01/2013 Time: 4098-1191 PT Time Calculation (min): 22 min  PT Assessment / Plan / Recommendation History of Present Illness  Spanish speaking male with two story fall yesterday.   Plastic surgery consulted for multiple facial fractures. 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 Did not wear glasses or have vision problems prior. Notes double vision when looking to sides. pt is s/p ORIF Rt wrist.   Clinical Impression  Pt adm due to the above. Patient is s/p Rt ORIF wrist surgery and is awaiting Lt long arm splint for management of Lt radial head fx resulting in functional limitations due to the deficits listed below (see PT Problem List). Patient will benefit from skilled PT to increase their independence and safety with mobility to allow discharge to the venue listed below. Evaluation limited to sitting EOB today due to noted pink/clear drainage from nose, pt was immediately returned to supine position and RN notified. Unable to determine D/C disposition due to limited mobility.      PT Assessment  Patient needs continued PT services    Follow Up Recommendations  Other (comment) (TBD; eval limited today )    Does the patient have the potential to tolerate intense rehabilitation      Barriers to Discharge Decreased caregiver support per friend; pt lives alone; wife lives in [place]     Equipment Recommendations  Other (comment) (TBD)    Recommendations for Other Services     Frequency Min 4X/week    Precautions / Restrictions Precautions Precautions: Fall Required Braces or Orthoses: Sling (Lt UE ) Restrictions Weight Bearing Restrictions: Yes RUE Weight Bearing: Non weight bearing Other Position/Activity Restrictions: awaiting orders regarding ROM restrictions for  bil UEs and WB status of Lt UE; Lt UE in sling   Pertinent Vitals/Pain Stable t/o session; did not rate his pain today. Was repositioned with bil UEs elevated by pillows.       Mobility  Bed Mobility Overal bed mobility: Needs Assistance Bed Mobility: Supine to Sit;Sit to Supine Supine to sit: Supervision;HOB elevated Sit to supine: Min assist General bed mobility comments: pt required (A) to bring LEs onto bed to return to supine position; requires incr time due to pain; cues for sequencing  Transfers General transfer comment: unable to assess today due to drainage coming out of nose when in sitting position  Ambulation/Gait General Gait Details: unable to assess          PT Diagnosis: Difficulty walking;Acute pain  PT Problem List: Decreased range of motion;Decreased activity tolerance;Decreased balance;Decreased mobility;Decreased cognition;Decreased knowledge of use of DME;Decreased safety awareness;Decreased knowledge of precautions;Pain PT Treatment Interventions: DME instruction;Gait training;Functional mobility training;Therapeutic activities;Therapeutic exercise;Balance training;Neuromuscular re-education;Patient/family education     PT Goals(Current goals can be found in the care plan section) Acute Rehab PT Goals Patient Stated Goal: none stated today PT Goal Formulation: With patient Time For Goal Achievement: 11/15/13 Potential to Achieve Goals: Good  Visit Information  Last PT Received On: 11/01/13 Assistance Needed: +1 History of Present Illness: Spanish speaking male with two story fall yesterday.   Plastic surgery consulted for multiple facial fractures. 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 Did not wear glasses or have vision problems prior. Notes double vision when looking  to sides. pt is s/p ORIF Rt wrist.        Prior Functioning  Home  Living Family/patient expects to be discharged to:: Private residence Living Arrangements: Alone Available Help at Discharge: Friend(s);Available PRN/intermittently Type of Home: Apartment Home Layout: One level Home Equipment: None Additional Comments: pt lives alone in apartment per friend; pt is from [place] and that is where his family lives; friend interpreted for session  Prior Function Level of Independence: Independent Communication Communication: Prefers language other than English;Other (comment)    Cognition  Cognition Arousal/Alertness: Lethargic;Suspect due to medications Behavior During Therapy: Flat affect Overall Cognitive Status: Difficult to assess Difficult to assess due to: Non-English speaking;Level of arousal    Extremity/Trunk Assessment Upper Extremity Assessment Upper Extremity Assessment: Defer to OT evaluation Lower Extremity Assessment Lower Extremity Assessment: Overall WFL for tasks assessed Cervical / Trunk Assessment Cervical / Trunk Assessment: Normal   Balance Balance Overall balance assessment: Needs assistance Sitting-balance support: Feet supported;No upper extremity supported Sitting balance-Leahy Scale: Good Sitting balance - Comments: noted drainage from nose in sitting position: RN immediately notifed   End of Session PT - End of Session Activity Tolerance: Patient limited by pain;Patient limited by lethargy;Other (comment) (Drainage coming out of nose) Patient left: in bed;with call bell/phone within reach;with nursing/sitter in room Nurse Communication: Mobility status;Other (comment) (drainage coming from nose; pink/clear drainage )  GP     Donell SievertWest, Tonya Carlile N, South CarolinaPT [phone] 11/01/2013, 1:10 PM

## 2013-11-01 NOTE — Progress Notes (Signed)
Orthopedic Tech Progress Note Patient Details:  Tommy Lee Sep 16, 1971 [id number]  Ortho Devices Type of Ortho Device: Long arm splint Ortho Device/Splint Interventions: Application   Shawnie PonsCammer, Mavery Milling Carol 11/01/2013, 10:54 AM

## 2013-11-01 NOTE — Progress Notes (Signed)
Subjective:  Patient reports pain as mild  Objective:   VITALS:   Filed Vitals:   11/01/13 0900 11/01/13 1100 11/01/13 1200 11/01/13 1300  BP: 146/64 144/70 128/61 133/73  Pulse: 119 124 128 131  Temp:  98.5 F (36.9 C)    TempSrc:  Oral    Resp: 26 21 20 14   Height:      Weight:      SpO2: 97% 96% 96% 96%    Physical Exam  RUE: Dressing: C/D/I  BUE: Compartments soft  SILT M/R/U, 2+rad puls, +EPL/FPL/IO  LABS  Results for orders placed during the hospital encounter of 10/31/13 (from the past 24 hour(s))  URINALYSIS, ROUTINE W REFLEX MICROSCOPIC     Status: Abnormal   Collection Time    10/31/13  3:02 PM      Result Value Range   Color, Urine RED (*) YELLOW   APPearance CLOUDY (*) CLEAR   Specific Gravity, Urine 1.024  1.005 - 1.030   pH 6.0  5.0 - 8.0   Glucose, UA 100 (*) NEGATIVE mg/dL   Hgb urine dipstick LARGE (*) NEGATIVE   Bilirubin Urine NEGATIVE  NEGATIVE   Ketones, ur 15 (*) NEGATIVE mg/dL   Protein, ur NEGATIVE  NEGATIVE mg/dL   Urobilinogen, UA 0.2  0.0 - 1.0 mg/dL   Nitrite NEGATIVE  NEGATIVE   Leukocytes, UA TRACE (*) NEGATIVE  URINE MICROSCOPIC-ADD ON     Status: None   Collection Time    10/31/13  3:02 PM      Result Value Range   WBC, UA 0-2  <3 WBC/hpf   RBC / HPF TOO NUMEROUS TO COUNT  <3 RBC/hpf   Bacteria, UA RARE  RARE  MRSA PCR SCREENING     Status: None   Collection Time    10/31/13  8:12 PM      Result Value Range   MRSA by PCR NEGATIVE  NEGATIVE  CBC     Status: Abnormal   Collection Time    10/31/13  9:30 PM      Result Value Range   WBC 17.2 (*) 4.0 - 10.5 K/uL   RBC 3.69 (*) 4.22 - 5.81 MIL/uL   Hemoglobin 12.1 (*) 13.0 - 17.0 g/dL   HCT 16.134.1 (*) 09.639.0 - 04.552.0 %   MCV 92.4  78.0 - 100.0 fL   MCH 32.8  26.0 - 34.0 pg   MCHC 35.5  30.0 - 36.0 g/dL   RDW 40.913.6  81.111.5 - 91.415.5 %   Platelets 225  150 - 400 K/uL  AMYLASE     Status: None   Collection Time    10/31/13  9:30 PM      Result Value Range   Amylase 101  0 - 105  U/L  LIPASE, BLOOD     Status: None   Collection Time    10/31/13  9:30 PM      Result Value Range   Lipase 34  11 - 59 U/L  BASIC METABOLIC PANEL     Status: Abnormal   Collection Time    11/01/13  2:10 AM      Result Value Range   Sodium 141  137 - 147 mEq/L   Potassium 4.3  3.7 - 5.3 mEq/L   Chloride 104  96 - 112 mEq/L   CO2 20  19 - 32 mEq/L   Glucose, Bld 219 (*) 70 - 99 mg/dL   BUN 20  6 - 23 mg/dL  Creatinine, Ser 1.01  0.50 - 1.35 mg/dL   Calcium 7.5 (*) 8.4 - 10.5 mg/dL   GFR calc non Af Amer >90  >90 mL/min   GFR calc Af Amer >90  >90 mL/min  CBC     Status: Abnormal   Collection Time    11/01/13  2:10 AM      Result Value Range   WBC 14.4 (*) 4.0 - 10.5 K/uL   RBC 3.36 (*) 4.22 - 5.81 MIL/uL   Hemoglobin 10.6 (*) 13.0 - 17.0 g/dL   HCT 16.1 (*) 09.6 - 04.5 %   MCV 91.1  78.0 - 100.0 fL   MCH 31.5  26.0 - 34.0 pg   MCHC 34.6  30.0 - 36.0 g/dL   RDW 40.9  81.1 - 91.4 %   Platelets 232  150 - 400 K/uL     Assessment/Plan: 1 Day Post-Op   Active Problems:   Multiple facial fractures   Fall   Traumatic pneumocephalus   Left elbow fracture   Open fracture of right wrist   Concussion with loss of consciousness   Acute respiratory failure   Carotid artery injury   Renal contusion   Acute blood loss anemia   PLAN: Weight Bearing: NWB RLE, ROM ok LLE when tolerated but no lifting >5lbs Dressings: keep Dressing C/D/I. [place]so[place] for LUE, sling not necessary  VTE prophylaxis: per trauma team Dispo: ok for d/c when cleared by trauma F/u with me in 2wks   MURPHY, TIMOTHY, D 11/01/2013, 1:50 PM   Margarita Rana, MD Cell [phone]

## 2013-11-01 NOTE — Clinical Social Work Note (Signed)
Clinical Social Work Department BRIEF PSYCHOSOCIAL ASSESSMENT 11/01/2013  Patient:  Tommy Lee, Tommy Lee     Account Number:  [id number]     Admit date:  10/31/2013  Clinical Social Worker:  Myles Lipps  Date/Time:  11/01/2013 02:30 PM  Referred by:  Physician  Date Referred:  11/01/2013 Referred for  Psychosocial assessment   Other Referral:   Interview type:  Patient Other interview type:   Patient employer at bedside    PSYCHOSOCIAL DATA Living Status:  FRIEND(S) Admitted from facility:   Level of care:   Primary support name:  Kirk Ruths  [phone] Primary support relationship to patient:  FRIEND Degree of support available:   Strong    CURRENT CONCERNS Current Concerns  Adjustment to Illness  Post-Acute Placement   Other Concerns:    SOCIAL WORK ASSESSMENT / PLAN Clinical Social Worker met with patient at bedside with interpreter present to offer support and discuss patient needs at discharge.  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  Patient states that he has been in the [place] for about 9 years working and providing for his family in [place] (wife and 3 children 41, 23, 47).  Patient [place] with his friends.  Per patient employer, patient had been hired only 3 days prior to this accident.  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  Patient feels that he should be able to return home with friends who will provide 24 hour support at discharge.    Clinical Social Worker inquired about current substance use.  Patient states that he does not drink alcohol or use drugs on a regular basis.  SBIRT completed and no resources needed at this time.  CSW will remain available for support and to facilitate patient discharge needs once medically stable.   Assessment/plan status:   Psychosocial Support/Ongoing Assessment of Needs Other assessment/ plan:   Information/referral to community resources:   Clinical Social Worker utilized interpreting services Spero Geralds) to communicate with patient and patient employer at bedside.  SBIRT completed and no resources needed at this time.    PATIENT'S/FAMILY'S RESPONSE TO PLAN OF CARE: Patient alert and oriented x3 laying in the bed.  Patient with limited use of his arms, however looks comfortable in the bed.  Patient employer verbalized his concerns regarding patient ability to participate in rehab program if needed due to lack of coverage.  CSW eased concerns stating that appropriate rehab would be arranged pending patient needs at discharge.  Patient and patient employer verbalized their appreciation for CSW support and concern.

## 2013-11-01 NOTE — Progress Notes (Signed)
Note elevated lab glucose=219 mg/dL.  No history of diabetes noted.  Consider checking CBG's q 4 hours.  If greater than 140 mg/dL, consider adding Novolog sensitive correction tid with meals.  May also consider checking A1C to determine pre-hospitalization glycemic control. Thanks, Beryl MeagerJenny Chinyere Galiano, RN, BC-ADM Inpatient Diabetes Coordinator Pager [phone]

## 2013-11-01 NOTE — Progress Notes (Signed)
UR completed.  Modest Draeger, RN BSN [facility] CCM Trauma/Neuro ICU Case Manager [phone]  

## 2013-11-01 NOTE — Consult Note (Signed)
Date 11/01/13 [facility] Reason for Consult: facial fractures Referring Physician: Dr. Gillermo MurdochWyatt  Tommy Lee is an 43 y.o. male.  HPI: Spanish speaking male with two story fall yesterday.   Plastic surgery consulted for multiple facial fractures. 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 Did not wear glasses or have vision problems prior. Notes double vision when looking to sides. Per staff report, he had clear nasal drainage this am when up with PT. Pt reports feeling of water in back of throat currently.   History: ORIF R radius 10/31/13  Social History:  reports that he has never smoked. He does not have any smokeless tobacco history on file. He reports that he does not drink alcohol. His drug history is not on file.  Allergies: No Known Allergies  Medications: I have reviewed the patient's current medications.    ROS Blood pressure 144/70, pulse 124, temperature 98.5 F (36.9 C), temperature source Oral, resp. rate 21, height 5\' 10"  (1.778 m), weight 79.9 kg (176 lb 2.4 oz), SpO2 96.00%. Physical Exam Alert oriented Cooperative with exam C collar in place bilateral periorbital ecchymoses Dried blood nares, edema nose, no gross deviation nose EOMI, left conjunctival hemmorhage Gross visual exam normal on right, left with no light sensitivity or vision occclusion normal  Assessment/Plan: CTs reviewed personally: Finding inc nondisplace left zygoma arch, left nasal bone min displaced Anterior and posterior table frontal sinus fractures with some comminution posterior table, one area medially that is displaced Fractures of frontal bone left lateral orbit (no displacement at spheno ethmoid suture),  non displaced medial orbit fracture on left and right Comminuted fracture cribiform    Ophthalmology consult pending. Visual loss left eye. Majority of fractures are non  operative. However there is some displacement posterior table frontal sinus; long term risk of mucocele/infection. Have discussed with Neurosurgery that if there is any indication for craniotomy, I can assist with sinus obliteration. At this time plan to treat possible CSF leak with conservative measures. Will follow.   Tommy FellowsBrinda Starlee Corralejo, MD [facility] Plastic & Reconstructive Surgery [phone]

## 2013-11-01 NOTE — Progress Notes (Signed)
OT Cancellation Note  Patient Details Name: Tommy Lee MRN: [id number] DOB: 08-16-1971   Cancelled Treatment:    Reason Eval/Treat Not Completed: Medical issues which prohibited therapy (AFter PT, pt with fluid leaking from nose. Bedrest orders.)  Will see in am [facility],HILLARY Shanta Dorvil, OTR/L  [phone] 11/01/2013 11/01/2013, 12:12 PM

## 2013-11-01 NOTE — Consult Note (Signed)
Ophthalmology Consult  This is a 43 yo hispanic gentleman s/p fall with multiple facial fractures.  Pt has decreased vision in the left eye.  The CT shows fractures in the left eye worse than the right with possible compression of left optic nerve.Pt has no PMHx or PSHx.  Pt works [employer] representative[employer].  Pt on no medications at home.  ROS significant for injuries sustained in fall  On exam pt was 20/40 right eye and Hand Motions only in the left eye.  Pt had a positive afferent pupillary defect in the left eye.  Visual field full in the right eye and unable to assess left eye due to vision.  EOM were intact with full ductions and versions.  IOP was 16 in both eyes.  Pt has echymosis of both eyelids and mild subconjunctival hemorrhage in the left eye with the borders seen.  Cornea clear both eyes and Iris round in both eyes.  Lenses were clear in both eyes and on dilated exam retina was flat and intact in both eyes with normal appearing optic nerves in both eyes with a c/d ratio of 0.4 in both eyes.   A/P 1. 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  There was no open globe or penetrating trauma.  I would recommend observation at this time.  Steroids would likely be of no benefit based on etiology of trauma and would be contraindicated in cases of TBI.   No sign of anterior traumatic optic neuropathy.  Pt to see me as outpatient for full exam.  Thank you for allowing me to participate in the care of this patient. Please feel free to contact me if you have any concerns.  Mia Creekimothy Hartman Minahan, M.D. (cell) [phone] (office) [phone] [address] [place]

## 2013-11-01 NOTE — Progress Notes (Signed)
Pt seen and examined. No issues overnight. Pt did c/o facial pain, no HA. Denied any N/T/W. After he was made upright with PT, there was pink-tinged fluid drainage from nose.  EXAM: Temp:  [94.5 F (34.7 C)-99.1 F (37.3 C)] 98.5 F (36.9 C) (02/04 1100) Pulse Rate:  [46-128] 128 (02/04 1200) Resp:  [15-27] 20 (02/04 1200) BP: (62-150)/(33-75) 128/61 mmHg (02/04 1200) SpO2:  [95 %-100 %] 96 % (02/04 1200) Weight:  [79.9 kg (176 lb 2.4 oz)] 79.9 kg (176 lb 2.4 oz) (02/03 1930) Intake/Output     02/03 0701 - 02/04 0700 02/04 0701 - 02/05 0700   P.O.  60   I.V. (mL/kg) 3528.3 (44.2) 300 (3.8)   IV Piggyback 100 50   Total Intake(mL/kg) 3628.3 (45.4) 410 (5.1)   Urine (mL/kg/hr) 1710 155 (0.4)   Blood 20    Total Output 1730 155   Net +1898.3 +255        Emesis Occurrence 1 x     Awake, alert, oriented in spanish Unable to count fingers from left eye, pupil reactive Good strength BUE/BLE  LABS: Lab Results  Component Value Date   CREATININE 1.01 11/01/2013   BUN 20 11/01/2013   NA 141 11/01/2013   K 4.3 11/01/2013   CL 104 11/01/2013   CO2 20 11/01/2013   Lab Results  Component Value Date   WBC 14.4* 11/01/2013   HGB 10.6* 11/01/2013   HCT 30.6* 11/01/2013   MCV 91.1 11/01/2013   PLT 232 11/01/2013    IMAGING: CTA reviewed demonstrating non-flow limiting supraclinoid LICA dissection with filling of ACA/MCA.  IMPRESSION: - 43 y.o. male s/p fall with LICA dissection and CSF rhinorrhea from facial/sinus fractures. Neurologically intact  PLAN: - Bedrest for 24-48hrs for CSF leak. After this time if still leaking will place lumbar drain - ASA 325mg  daily for LICA dissection - Mgmt of concomitant injuries per primary service.

## 2013-11-01 NOTE — Progress Notes (Addendum)
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 fracture involving the left cribriform plate and planum sphenoidale.  CT CERVICAL SPINE FINDINGS  Vertebral alignment is normal. Prevertebral soft tissues are unremarkable. No cervical spine fracture is identified. Mild endplate spurring is present at C5-6 and C6-7. The paraspinal soft tissues are unremarkable.  IMPRESSION: 1. Extensive maxillofacial fractures as above, including comminuted fractures of the left frontal bone involving the frontal sinus as well as ethmoid and sphenoid bones disrupting the floor of the anterior cranial fossa. These are open fractures, and there is associated pneumocephalus. Fracture of the inferolateral left frontal bone is mildly depressed. 2. No intracranial hemorrhage. 3. No cervical spine fracture identified.  These results were called by telephone at the time of interpretation on 10/31/2013 at 1:11 PM to Dr. Raeford Razor , who verbally acknowledged these results.   Electronically Signed   By: Sebastian Ache   On: 10/31/2013 13:40    Anti-infectives: Anti-infectives   Start     Dose/Rate Route Frequency Ordered Stop   11/01/13 0600  ceFAZolin (ANCEF) IVPB 2 g/50 mL premix  Status:  Discontinued     2 g 100 mL/hr over 30 Minutes Intravenous On call to O.R. 10/31/13 1923 10/31/13 1955   10/31/13 2000  ceFAZolin (ANCEF) IVPB 2 g/50 mL premix     2 g 100 mL/hr over 30 Minutes Intravenous Every 6  hours 10/31/13 1923 11/01/13 1359   10/31/13 1315  clindamycin (CLEOCIN) IVPB 600 mg     600 mg 100 mL/hr over 30 Minutes Intravenous  Once 10/31/13 1313 10/31/13 1447   10/31/13 1230  ceFAZolin (ANCEF) IVPB 1 g/50 mL premix     1 g 100 mL/hr over 30 Minutes Intravenous  Once 10/31/13 1216 10/31/13 1311   10/31/13 1200  [MAR Hold]  ceFAZolin (ANCEF) 2 g in dextrose 5 % 50 mL IVPB     (On MAR Hold since 10/31/13 1457)   2 g 140 mL/hr over 30 Minutes Intravenous  Once 10/31/13 1156 10/31/13 1601      Assessment/Plan: s/p Procedure(s): OPEN REDUCTION INTERNAL FIXATION (ORIF) WRIST FRACTURE Has dissection of left  carotid.  Needs to be on ASA  Will start today. d/c foley PAS Advance diet Get OOB with PT. NWB right wrist Decrease IVF rate. Transfer to 4N Flexion extension C-spine X-rays.  LOS: 1 day   Marta Lamas. Gae Bon, MD, FACS [phone] Trauma Surgeon 11/01/2013

## 2013-11-02 DIAGNOSIS — D62 Acute posthemorrhagic anemia: Secondary | ICD-10-CM

## 2013-11-02 LAB — CBC WITH DIFFERENTIAL/PLATELET
Basophils Absolute: 0 10*3/uL (ref 0.0–0.1)
Basophils Relative: 0 % (ref 0–1)
EOS PCT: 0 % (ref 0–5)
Eosinophils Absolute: 0 10*3/uL (ref 0.0–0.7)
HCT: 21.7 % — ABNORMAL LOW (ref 39.0–52.0)
Hemoglobin: 7.5 g/dL — ABNORMAL LOW (ref 13.0–17.0)
LYMPHS ABS: 1.2 10*3/uL (ref 0.7–4.0)
LYMPHS PCT: 9 % — AB (ref 12–46)
MCH: 31.6 pg (ref 26.0–34.0)
MCHC: 34.6 g/dL (ref 30.0–36.0)
MCV: 91.6 fL (ref 78.0–100.0)
Monocytes Absolute: 1.2 10*3/uL — ABNORMAL HIGH (ref 0.1–1.0)
Monocytes Relative: 9 % (ref 3–12)
NEUTROS ABS: 10.8 10*3/uL — AB (ref 1.7–7.7)
Neutrophils Relative %: 82 % — ABNORMAL HIGH (ref 43–77)
PLATELETS: 153 10*3/uL (ref 150–400)
RBC: 2.37 MIL/uL — AB (ref 4.22–5.81)
RDW: 13.7 % (ref 11.5–15.5)
WBC: 13.2 10*3/uL — AB (ref 4.0–10.5)

## 2013-11-02 LAB — BASIC METABOLIC PANEL
BUN: 21 mg/dL (ref 6–23)
CO2: 24 meq/L (ref 19–32)
Calcium: 7.7 mg/dL — ABNORMAL LOW (ref 8.4–10.5)
Chloride: 103 mEq/L (ref 96–112)
Creatinine, Ser: 0.85 mg/dL (ref 0.50–1.35)
GFR calc Af Amer: >90 mL/min (ref >90)
GFR calc non Af Amer: >90 mL/min (ref >90)
Glucose, Bld: 133 mg/dL — ABNORMAL HIGH (ref 70–99)
POTASSIUM: 3.8 meq/L (ref 3.7–5.3)
SODIUM: 138 meq/L (ref 137–147)

## 2013-11-02 LAB — HEMOGLOBIN AND HEMATOCRIT, BLOOD
HEMATOCRIT: 20 % — AB (ref 39.0–52.0)
Hemoglobin: 7.1 g/dL — ABNORMAL LOW (ref 13.0–17.0)

## 2013-11-02 MED ORDER — ACETAMINOPHEN 325 MG PO TABS
650.0000 mg | ORAL_TABLET | Freq: Four times a day (QID) | ORAL | Status: DC | PRN
  Administered 2013-11-03 – 2013-11-05 (×4): 650 mg via ORAL
  Filled 2013-11-02 (×4): qty 2

## 2013-11-02 NOTE — Progress Notes (Signed)
     Subjective:  Patient reports pain as moderate. Patient alert, able to follow commands with help of interpreter.  Objective:   VITALS:   Filed Vitals:   11/02/13 0500 11/02/13 0600 11/02/13 0700 11/02/13 0800  BP: 162/55 153/55 144/50 151/61  Pulse: 97 64 59 82  Temp:    98.6 F (37 C)  TempSrc:    Oral  Resp: 19 21 19 20   Height:      Weight:      SpO2: 98% 99% 98% 98%    Neurovascular intact Sensation intact distally Intact pulses distally Incision: dressing C/D/I and no drainage Able to flex, extend, abduct all fingers of his right hand Able to extend and flex wrist and hand of left upper extremity Bledsoe elbow brace in place, should remove posterior splint Volar splint in place right wrist  Lab Results  Component Value Date   WBC 13.2* 11/02/2013   HGB 7.5* 11/02/2013   HCT 21.7* 11/02/2013   MCV 91.6 11/02/2013   PLT 153 11/02/2013     Assessment/Plan: 2 Days Post-Op   Active Problems:   Multiple facial fractures   Fall   Traumatic pneumocephalus   Left elbow fracture   Open fracture of right wrist   Concussion with loss of consciousness   Acute respiratory failure   Carotid artery injury   Renal contusion   Acute blood loss anemia   Advance diet Up with therapy Continue plan per trauma Right wrist splint at all times Left elbow bledsoe brace at all times Remove posterior splint from left elbow  Torrie MayersDOUGLAS Yacine Droz 11/02/2013, 11:04 AM   Margarita Ranaimothy Murphy MD [phone]

## 2013-11-02 NOTE — Progress Notes (Signed)
No issues overnight. Pt denies any current c/o. No CSF oto/rhinorrhea per RN.  EXAM:  BP 151/61  Pulse 82  Temp(Src) 99.8 F (37.7 C) (Axillary)  Resp 20  Ht 5\' 10"  (1.778 m)  Wt 79.9 kg (176 lb 2.4 oz)  BMI 25.27 kg/m2  SpO2 98%  Awake, alert, oriented  Non-functional vision OS Speech fluent, appropriate in spanish Good strength  IMPRESSION:  43 y.o. male s/p fall with facial/sinus fractures and CSF rhinorrhea when up yesterday. Neurologically intact  PLAN: - Cont non-operative mgmt of CSF leak. Will attempt to get pt up tomorrow. If leak continues, will place lumbar drain.

## 2013-11-02 NOTE — Progress Notes (Signed)
OT Cancellation Note  Patient Details Name: Tommy Lee MRN: [id number] DOB: 1970-12-23   Cancelled Treatment:    Reason Eval/Treat Not Completed: Other (comment) Pt to have scan this am. Currently on bedrest. Will await activity orders to begin. [facility]ILLARY Osvaldo Lamping, OTR/L  [phone] 11/02/2013 11/02/2013, 8:11 AM

## 2013-11-02 NOTE — Progress Notes (Signed)
OT Cancellation Note  Patient Details Name: Tommy Lee MRN: [id number] DOB: 19-Feb-1971   Cancelled Treatment:    Reason Eval/Treat Not Completed: Other (comment) Pt remains on bedrest. Have contacted ortho regarding clarification regarding ROM and WBS. Will begin  @ bed level after clarification received. [facility] Chevelle Coulson, OTR/L  [phone] 11/02/2013 11/02/2013, 9:18 AM

## 2013-11-02 NOTE — Progress Notes (Signed)
Orthopedic Tech Progress Note Patient Details:  Tommy Lee 06/03/1971 [id number]  Patient ID: Tommy Lee, male   DOB: 06/03/1971, 43 y.o.   MRN: [id number] Brace order completed by Storm Friskbio-tech  Marshayla Mitschke 11/02/2013, 4:51 PM

## 2013-11-02 NOTE — Progress Notes (Signed)
Occupational Therapy Evaluation Patient Details Name: Tommy Lee MRN: [id number] DOB: [birth year] Today's Date: 11/02/2013 Time: [id number] OT Time Calculation (min): 27 min  OT Assessment / Plan / Recommendation History of present illness Spanish speaking male with two story fall yesterday.   Plastic surgery consulted for multiple facial fractures. 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 Did not wear glasses or have vision problems prior. Notes double vision when looking to sides. pt is s/p ORIF Rt wrist.    Clinical Impression   Pt seen today for eval of BUE. Discussed with Dr. Thurston HoleWainer and received clarification. Per MD, removed L plaster splint and locked Bledsoe brace at 90 degrees. Pt completed A/AAROM L elbow @ 30 degrees total within pain tolerance. NWB LUE. Weak wrist extensors. Positioned to elevate LUE and provide support to wrist. RUE immobilized at wrist and MP joints. Moving shoulder WFL. Educated to move digits frequently. Discussed importance of keeping BUE elevated. Ice to BUE. Instructions for nsg posted in room. Will complete OOB eval tomorrow if cleared by neuro. Pt will most likely be able to D/C home if 24/7 assistance will be available. If not, alternative D/C plans will need to be addressed.     OT Assessment  Patient needs continued OT Services    Follow Up Recommendations  Home health OT;Supervision/Assistance - 24 hour    Barriers to Discharge Other (comment) (unsure of caregiver support)    Equipment Recommendations  Other (comment) (AE)    Recommendations for Other Services    Frequency  Min 3X/week    Precautions / Restrictions Precautions Precautions: Fall Required Braces or Orthoses: Other Brace/Splint;Sling (Bledsoe brace LUE. R wrist splint) Restrictions Weight Bearing Restrictions: Yes RUE Weight Bearing: Non weight bearing LUE Weight Bearing:  Non weight bearing   Pertinent Vitals/Pain Pain L UE 6/10. Pain meds given Repositioned. ice    ADL  ADL Comments: total A at this time    OT Diagnosis: Generalized weakness;Disturbance of vision;Acute pain  OT Problem List: Decreased strength;Decreased range of motion;Decreased activity tolerance;Impaired vision/perception;Decreased coordination;Decreased knowledge of use of DME or AE;Decreased knowledge of precautions;Impaired sensation;Impaired UE functional use;Pain;Increased edema OT Treatment Interventions: Self-care/ADL training;Therapeutic exercise;Energy conservation;DME and/or AE instruction;Splinting;Therapeutic activities;Visual/perceptual remediation/compensation;Patient/family education   OT Goals(Current goals can be found in the care plan section) Acute Rehab OT Goals Patient Stated Goal: none stated today OT Goal Formulation: Patient unable to participate in goal setting Time For Goal Achievement: 11/16/13 Potential to Achieve Goals: Good  Visit Information  Last OT Received On: 11/02/13 History of Present Illness: Spanish speaking male with two story fall yesterday.   Plastic surgery consulted for multiple facial fractures. 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 Did not wear glasses or have vision problems prior. Notes double vision when looking to sides. pt is s/p ORIF Rt wrist.        Prior Functioning     Home Living Family/patient expects to be discharged to:: Private residence Living Arrangements: Alone Additional Comments: pt lives alone in apartment per friend; pt is from [place] and that is where his family lives; friend interpreted for session  Prior Function Level of Independence: Independent Comments:  (worked as Education administratorpainter) MusicianCommunication Communication: Prefers language other than English (spainish) Dominant Hand: Right          Vision/Perception Vision -  History Baseline Vision: No visual deficits Patient Visual Report: Blurring of vision Vision - Assessment Additional Comments: will further assess   Cognition  Cognition Arousal/Alertness: Lethargic;Suspect due to medications Behavior During Therapy: Flat affect Overall Cognitive Status: Difficult to assess Difficult to assess due to: Non-English speaking    Extremity/Trunk Assessment Upper Extremity Assessment Upper Extremity Assessment: RUE deficits/detail;LUE deficits/detail RUE Deficits / Details: R wrist in splint above MP joints. Fingers edematous.ROM shoulder WFL. limited sup/pronation  RUE: Unable to fully assess due to pain;Unable to fully assess due to immobilization RUE Coordination: decreased fine motor;decreased gross motor LUE Deficits / Details: Pt with hard splint under bedsoe brace with brace @ 30 degrees extension; weak composite flexion/extension (weak wrist; diffculty extending against gravity) LUE: Unable to fully assess due to pain;Unable to fully assess due to immobilization LUE Sensation:  (unable to assess due to language) LUE Coordination: decreased fine motor;decreased gross motor     Mobility   not assessed. Bed level eval    Exercise Other Exercises Other Exercises: L elbow AAROM within pain tolerance @ 30 degrees total Other Exercises: L  Other Exercises: A/AAROM wrist/hand.L Other Exercises: R gidit ROM shoulder ROM Other Exercises: BUE elevated on 2-3 pillows   Balance General Comments General comments (skin integrity, edema, etc.): Pt seen at bed level today for eval of BUE   End of Session OT - End of Session Activity Tolerance: Patient limited by pain;Treatment limited secondary to medical complications (Comment) (bed level eval for BUE) Patient left: in bed;with call bell/phone within reach;with nursing/sitter in room Nurse Communication: Precautions;Weight bearing status;Other (comment);Patient requests pain  meds (BUE protocol)  GO     Catrina Fellenz,HILLARY 11/02/2013, 3:17 PM [facility], OTR/L  [phone] 11/02/2013

## 2013-11-02 NOTE — Progress Notes (Signed)
Patient ID: Tommy Lee, male   DOB: 08-Jul-1971, 43 y.o.   MRN: [id number] 2 Days Post-Op  Subjective: C/O some pain L eye. Thirsty. Denies abdominal pain.  Objective: Vital signs in last 24 hours: Temp:  [98.5 F (36.9 C)-99.9 F (37.7 C)] 99.8 F (37.7 C) (02/05 0329) Pulse Rate:  [59-131] 82 (02/05 0800) Resp:  [14-33] 20 (02/05 0800) BP: (128-167)/(49-73) 151/61 mmHg (02/05 0800) SpO2:  [90 %-99 %] 98 % (02/05 0800) Last BM Date: 10/31/13  Intake/Output from previous day: 02/04 0701 - 02/05 0700 In: 1690 [P.O.:540; I.V.:1100; IV Piggyback:50] Out: 1030 [Urine:1030] Intake/Output this shift:    Eyes: periorbital ecchymosis, PERL, conj edema L Neck: collar Resp: clear to auscultation bilaterally Cardio: regular rate and rhythm GI: soft, ND, minimal tenderness LUQ Extremities: R wrist ortho dressing, LUE ortho dressing and elbow brace Neuro: decreased vision OU, F/C, speech clear  Lab Results: CBC   Recent Labs  11/01/13 0210 11/02/13 0222  WBC 14.4* 13.2*  HGB 10.6* 7.5*  HCT 30.6* 21.7*  PLT 232 153   BMET  Recent Labs  11/01/13 0210 11/02/13 0222  NA 141 138  K 4.3 3.8  CL 104 103  CO2 20 24  GLUCOSE 219* 133*  BUN 20 21  CREATININE 1.01 0.85  CALCIUM 7.5* 7.7*    Anti-infectives: Anti-infectives   Start     Dose/Rate Route Frequency Ordered Stop   11/01/13 0600  ceFAZolin (ANCEF) IVPB 2 g/50 mL premix  Status:  Discontinued     2 g 100 mL/hr over 30 Minutes Intravenous On call to O.R. 10/31/13 1923 10/31/13 1955   10/31/13 2000  ceFAZolin (ANCEF) IVPB 2 g/50 mL premix     2 g 100 mL/hr over 30 Minutes Intravenous Every 6 hours 10/31/13 1923 11/01/13 0846   10/31/13 1315  clindamycin (CLEOCIN) IVPB 600 mg     600 mg 100 mL/hr over 30 Minutes Intravenous  Once 10/31/13 1313 10/31/13 1447   10/31/13 1230  ceFAZolin (ANCEF) IVPB 1 g/50 mL premix     1 g 100 mL/hr over 30 Minutes Intravenous  Once 10/31/13 1216 10/31/13 1311   10/31/13 1200   [MAR Hold]  ceFAZolin (ANCEF) 2 g in dextrose 5 % 50 mL IVPB     (On MAR Hold since 10/31/13 1457)   2 g 140 mL/hr over 30 Minutes Intravenous  Once 10/31/13 1156 10/31/13 1601      Assessment/Plan: Fall Multiple facial FXs - per Dr. Leta Baptisthimmappa Anterior and posterior table frontal sinus FXs with pneumocephalus - ? CSF leak yesterday. Bedrest per Dr. Conchita ParisNundkumar for 48h. Plan lumbar drain if persists. OU optic nerve injury - F/U with Dr. Vonna KotykBevis after D/C L intracranial ICA dissection - ASA per Dr. Conchita ParisNundkumar FEN - advance to D3 today L renal contusion - urine appears clear Open R wrist and L elbow FXs - S/P ORIF bu Dr. Eulah PontMurphy. NWB RUE, limited LUE ABL anemia - multiple sources. Check Hb at 1400. Cervical strain - flex ex OK, D/C collar Dispo - ICU with ? CSF leak per Dr. Conchita ParisNundkumar  LOS: 2 days    Violeta GelinasBurke Fareed Fung, MD, MPH, FACS Pager: [phone]  11/02/2013

## 2013-11-02 NOTE — Progress Notes (Signed)
HGB 7.1 HCT 20.0. Pulse, BP, urine output WNL.Marland Kitchen. Discussed with trauma. Continue to monitor.

## 2013-11-02 NOTE — Progress Notes (Signed)
PT Cancellation Note  Patient Details Name: Tommy Lee MRN: [id number] DOB: 12-Sep-1971   Cancelled Treatment:    Reason Eval/Treat Not Completed: Patient not medically ready;Medical issues which prohibited therapy. Pt currently on bedrest due to CSF leak. Will re-attempt to see pt when activity orders are updated.   Donnamarie PoagWest, Aidan Caloca [place], [place]PT [phone] 11/02/2013, 8:37 AM

## 2013-11-03 DIAGNOSIS — S069X9A Unspecified intracranial injury with loss of consciousness of unspecified duration, initial encounter: Secondary | ICD-10-CM

## 2013-11-03 DIAGNOSIS — S069XAA Unspecified intracranial injury with loss of consciousness status unknown, initial encounter: Secondary | ICD-10-CM

## 2013-11-03 LAB — BASIC METABOLIC PANEL WITH GFR
BUN: 11 mg/dL (ref 6–23)
CO2: 24 meq/L (ref 19–32)
Calcium: 8 mg/dL — ABNORMAL LOW (ref 8.4–10.5)
Chloride: 103 meq/L (ref 96–112)
Creatinine, Ser: 0.72 mg/dL (ref 0.50–1.35)
GFR calc Af Amer: >90 mL/min
GFR calc non Af Amer: >90 mL/min
Glucose, Bld: 111 mg/dL — ABNORMAL HIGH (ref 70–99)
Potassium: 3.9 meq/L (ref 3.7–5.3)
Sodium: 139 meq/L (ref 137–147)

## 2013-11-03 LAB — CBC
HCT: 19.9 % — ABNORMAL LOW (ref 39.0–52.0)
Hemoglobin: 6.9 g/dL — CL (ref 13.0–17.0)
MCH: 32.1 pg (ref 26.0–34.0)
MCHC: 34.7 g/dL (ref 30.0–36.0)
MCV: 92.6 fL (ref 78.0–100.0)
Platelets: 147 K/uL — ABNORMAL LOW (ref 150–400)
RBC: 2.15 MIL/uL — ABNORMAL LOW (ref 4.22–5.81)
RDW: 13.4 % (ref 11.5–15.5)
WBC: 11.1 K/uL — ABNORMAL HIGH (ref 4.0–10.5)

## 2013-11-03 MED ORDER — MORPHINE SULFATE 2 MG/ML IJ SOLN
2.0000 mg | INTRAMUSCULAR | Status: DC | PRN
  Administered 2013-11-04 (×2): 2 mg via INTRAVENOUS
  Filled 2013-11-03 (×3): qty 1

## 2013-11-03 MED ORDER — OXYCODONE HCL 5 MG PO TABS
5.0000 mg | ORAL_TABLET | ORAL | Status: DC | PRN
  Administered 2013-11-03 – 2013-11-04 (×2): 10 mg via ORAL
  Administered 2013-11-05: 15 mg via ORAL
  Administered 2013-11-05: 10 mg via ORAL
  Administered 2013-11-06 (×2): 15 mg via ORAL
  Filled 2013-11-03: qty 2
  Filled 2013-11-03: qty 3
  Filled 2013-11-03 (×2): qty 2
  Filled 2013-11-03 (×2): qty 3

## 2013-11-03 MED ORDER — METOPROLOL TARTRATE 1 MG/ML IV SOLN: 5.0000 mg | Freq: Four times a day (QID) | INTRAVENOUS | Status: DC | PRN

## 2013-11-03 NOTE — Progress Notes (Signed)
CRITICAL VALUE ALERT  Critical value received:  Hgb 6.9 Date of notification:  11/03/2013  Time of notification: 0410 Critical value read back:yes  Nurse who received alert:  Philis NettleLisa Rylyn Zawistowski  MD notified (1st page):  Dr. Janee Mornhompson  Time of first page:  0410 MD notified (2nd page):N/A  Time of second page:N/A  Responding MD:  Dr. Janee Mornhompson  Time MD responded:  [phone]

## 2013-11-03 NOTE — Progress Notes (Signed)
Patient is asleep but comfortable.  No distress  This patient has been seen and I agree with the findings and treatment plan.  Marta LamasJames O. Gae BonWyatt, III, MD, FACS [phone] (pager) [phone] (direct pager) Trauma Surgeon

## 2013-11-03 NOTE — Evaluation (Addendum)
Physical Therapy Re-Evaluation Patient Details Name: Tommy Lee MRN: [id number] DOB: July 26, 1971 Today's Date: 11/03/2013 Time: 4098-1191 PT Time Calculation (min): 40 min  PT Assessment / Plan / Recommendation History of Present Illness  Spanish speaking male with two story fall yesterday.   Plastic surgery consulted for multiple facial fractures. 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 Did not wear glasses or have vision problems prior. Notes double vision when looking to sides. pt is s/p ORIF Rt wrist.   Clinical Impression  Pt seen for re-evaluation today due to orders being discontinued. Pt was seen Wednesday, Feb. 4th and was limited due to drainage out of nose when sitting EOB. Pt has been on bedrest the past 48 hours. Pt able to sit EOB today without drainage. Presents with limitations in mobility secondary to deficits indicated below (see PT problem list). Pt to benefit from skilled acute PT to increase independence with mobility. Pt not able to see out of Lt eye today. Will need to assess vision and how it affects his gt next session. Goals that were set previously were reviewed and continue to be appropriate. Use of interpreter for session.     PT Assessment  Patient needs continued PT services    Follow Up Recommendations  Home health PT;Supervision/Assistance - 24 hour    Does the patient have the potential to tolerate intense rehabilitation      Barriers to Discharge   pt reports he has 24/7 (A)    Equipment Recommendations  None recommended by PT    Recommendations for Other Services     Frequency Min 4X/week    Precautions / Restrictions Precautions Precautions: Fall Precaution Comments: cannot see out of L eye Required Braces or Orthoses: Other Brace/Splint;Sling Other Brace/Splint: Bledsoe Brace on Lt Elbow and splint on Rt wrist  Restrictions Weight  Bearing Restrictions: Yes RUE Weight Bearing: Non weight bearing LUE Weight Bearing: Non weight bearing   Pertinent Vitals/Pain BP in sitting EOB 168/97; standing 191/122; sitting in chair 171/74; RN present. C/o headache 5/10       Mobility  Bed Mobility Overal bed mobility: Needs Assistance Bed Mobility: Supine to Sit Supine to sit: Min assist General bed mobility comments: (A) to elevate trunk due to NWB through bil UEs Transfers Overall transfer level: Needs assistance Equipment used: 2 person hand held assist Transfers: Sit to/from Stand Sit to Stand: Min assist Stand pivot transfers: Min assist General transfer comment: pt supported bilaterally for safety due to NWB status through bil UEs; + sway and slightly unsteady initally; pt c/o dizziness  Ambulation/Gait General Gait Details: pt dizzy and incr BP today     PT Diagnosis: Difficulty walking;Acute pain  PT Problem List: Decreased range of motion;Decreased activity tolerance;Decreased balance;Decreased mobility;Decreased cognition;Decreased knowledge of use of DME;Decreased safety awareness;Decreased knowledge of precautions;Pain PT Treatment Interventions: DME instruction;Gait training;Functional mobility training;Therapeutic activities;Therapeutic exercise;Balance training;Neuromuscular re-education;Patient/family education     PT Goals(Current goals can be found in the care plan section) Acute Rehab PT Goals Patient Stated Goal: none stated today PT Goal Formulation: With patient Time For Goal Achievement: 11/15/13 Potential to Achieve Goals: Good  Visit Information  Last PT Received On: 11/03/13 Assistance Needed: +1 PT/OT/SLP Co-Evaluation/Treatment: Yes Reason for Co-Treatment: Complexity of the patient's impairments (multi-system involvement) PT goals addressed during session: Mobility/safety with mobility;Balance History of Present Illness: Spanish speaking male with two story fall yesterday.   Plastic  surgery consulted for multiple facial fractures. 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 Did not wear glasses or have vision problems prior. Notes double vision when looking to sides. pt is s/p ORIF Rt wrist.        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 Fingers edematous.ROM shoulder WFL. limited sup/pronation  RUE Coordination: decreased fine motor;decreased gross motor LUE Deficits / Details: Pt with hard splint under bedsoe brace with brace @ 30 degrees extension; weak composite flexion/extension (weak wrist; diffculty extending against gravity) LUE Sensation:  (unable to assess due to language) LUE Coordination: decreased fine motor;decreased gross motor Lower Extremity Assessment Lower Extremity Assessment: Overall WFL for tasks assessed Cervical / Trunk Assessment Cervical / Trunk Assessment: Normal   Balance Balance Overall balance assessment: Needs assistance Sitting-balance support: Feet supported;No upper extremity  supported Sitting balance-Leahy Scale: Good Standing balance support: During functional activity;No upper extremity supported Standing balance-Leahy Scale: Fair  End of Session PT - End of Session Equipment Utilized During Treatment: Gait belt Activity Tolerance: Patient limited by pain Patient left: in chair;with call bell/phone within reach;with family/visitor present;with nursing/sitter in room Nurse Communication: Mobility status;Precautions  GP     Donell SievertWest, Casandra Dallaire N, [place] CarolinaPT [phone] 11/03/2013, 5:18 PM

## 2013-11-03 NOTE — Progress Notes (Signed)
Occupational Therapy Treatment Patient Details Name: Tommy Lee MRN: [id number] DOB: 06/20/1971 Today's Date: 11/03/2013 Time: 7829-5621 OT Time Calculation (min): 31 min  OT Assessment / Plan / Recommendation  History of present illness Spanish speaking male with two story fall yesterday.   Plastic surgery consulted for multiple facial fractures. 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 Did not wear glasses or have vision problems prior. Notes double vision when looking to sides. pt is s/p ORIF Rt wrist.    OT comments  Adapted utensils for self feeding. Contacted ortho regarding modifications to splint and Bledsoe brace. vo received to cut back R wrist brace to below MP crease and free thenar space in order to increase funcitonal use of hand.contace ortho tech with instructions. Will return this pm.  Follow Up Recommendations  Home health OT;Supervision/Assistance - 24 hour    Barriers to Discharge  Other (comment) (unsure of caregiver support)    Equipment Recommendations  Other (comment)    Recommendations for Other Services    Frequency Min 3X/week   Progress towards OT Goals Progress towards OT goals: Progressing toward goals  Plan Discharge plan remains appropriate    Precautions / Restrictions Precautions Precautions: Fall Precaution Comments: cannot see out of L eye Required Braces or Orthoses: Other Brace/Splint;Sling Other Brace/Splint: Bledsoe Brace on Lt Elbow and splint on Rt wrist  Restrictions Weight Bearing Restrictions: Yes RUE Weight Bearing: Non weight bearing LUE Weight Bearing: Non weight bearing   Pertinent Vitals/Pain C/o pain. premedicated    ADL  Eating/Feeding: Maximal assistance Grooming: +1 Total assistance Where Assessed - Grooming: Supported sitting Upper Body Bathing: +1 Total assistance Where Assessed - Upper Body Bathing:  Unsupported sitting Lower Body Bathing: +1 Total assistance Where Assessed - Lower Body Bathing: Supported sit to stand Upper Body Dressing: +1 Total assistance Where Assessed - Upper Body Dressing: Supported sit to stand Lower Body Dressing: +1 Total assistance Where Assessed - Lower Body Dressing: Supported sit to Pharmacist, hospital: Minimal Dentist Method: Sit to stand;Stand pivot Toileting - Clothing Manipulation and Hygiene: +1 Total assistance Where Assessed - Toileting Clothing Manipulation and Hygiene: Sit to stand from 3-in-1 or toilet Equipment Used: Gait belt Transfers/Ambulation Related to ADLs: Min A ADL Comments: worked with pt on using AE for increasing independence with self feeding using u cuff plate.     OT Diagnosis: Generalized weakness;Disturbance of vision;Acute pain  OT Problem List: Decreased strength;Decreased range of motion;Decreased activity tolerance;Impaired vision/perception;Decreased coordination;Decreased knowledge of use of DME or AE;Decreased knowledge of precautions;Impaired sensation;Impaired UE functional use;Pain;Increased edema OT Treatment Interventions: Self-care/ADL training;Therapeutic exercise;Energy conservation;DME and/or AE instruction;Splinting;Therapeutic activities;Visual/perceptual remediation/compensation;Patient/family education   OT Goals(current goals can now be found in the care plan section) Acute Rehab OT Goals Patient Stated Goal: go home OT Goal Formulation: With patient Time For Goal Achievement: 11/16/13 Potential to Achieve Goals: Good ADL Goals Pt Will Perform Eating: with supervision;with set-up;sitting Pt Will Perform Grooming: with set-up;with supervision;sitting;with adaptive equipment Pt Will Perform Upper Body Bathing: with min assist;with caregiver independent in assisting;sitting Pt Will Perform Lower Body Bathing: with min assist;with caregiver independent in assisting Pt Will Perform Upper  Body Dressing: with min assist;with caregiver independent in assisting;sitting Pt Will Transfer to Toilet: with modified independence;ambulating Pt Will Perform Toileting - Clothing Manipulation and hygiene: with modified independence;with adaptive equipment;sit to/from stand  Visit Information  Last OT Received On: 11/03/13 Assistance  Needed: +1 Reason for Co-Treatment: Complexity of the patient's impairments (multi-system involvement) PT goals addressed during session: Mobility/safety with mobility;Balance History of Present Illness: Spanish speaking male with two story fall yesterday.   Plastic surgery consulted for multiple facial fractures. 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 Did not wear glasses or have vision problems prior. Notes double vision when looking to sides. pt is s/p ORIF Rt wrist.     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 Communication: Prefers language other than English Dominant Hand: Right    Cognition  Cognition Arousal/Alertness: Awake/alert Behavior During Therapy: WFL for tasks assessed/performed Overall Cognitive Status: Within Functional Limits for tasks assessed Difficult to assess due to: Non-English speaking    Mobility  Bed Mobility Overal bed mobility: Needs Assistance Bed Mobility: Supine to Sit Supine to sit: Min assist General bed mobility comments: (A) to elevate trunk due to NWB through bil  UEs Transfers Overall transfer level: Needs assistance Equipment used: 2 person hand held assist Transfers: Sit to/from Stand Sit to Stand: Min assist Stand pivot transfers: Min assist General transfer comment: pt supported bilaterally for safety due to NWB status through bil UEs; + sway and slightly unsteady initally; pt c/o dizziness     Exercises  Other Exercises Other Exercises: L UE A/AAROM 45-130 Other Exercises: L  Other Exercises: A/AAROM wrist/hand.L Other Exercises: R gidit ROM shoulder ROM Other Exercises: BUE elevated on 2-3 pillows   Balance Balance Overall balance assessment: Needs assistance Sitting-balance support: Feet supported;No upper extremity supported Sitting balance-Leahy Scale: Good Standing balance support: During functional activity;No upper extremity supported Standing balance-Leahy Scale: Fair  End of Session OT - End of Session Equipment Utilized During Treatment: Gait belt Activity Tolerance: Patient tolerated treatment well Patient left: in chair;with call bell/phone within reach;with family/visitor present Nurse Communication: Precautions;Weight bearing status;Other (comment);Patient requests pain meds  GO     Merrell Rettinger,HILLARY 11/03/2013, 5:57 PM [facility] Amorina Doerr, OTR/L  [phone] 11/03/2013

## 2013-11-03 NOTE — Progress Notes (Signed)
Occupational Therapy Treatment Patient Details Name: Virgle Arth MRN: [id number] DOB: November 17, 1970 Today's Date: 11/03/2013 Time: 4098-1191 OT Time Calculation (min): 28 min  OT Assessment / Plan / Recommendation  History of present illness Spanish speaking male with two story fall yesterday.   Plastic surgery consulted for multiple facial fractures. 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 Did not wear glasses or have vision problems prior. Notes double vision when looking to sides. pt is s/p ORIF Rt wrist.    OT comments   R Wrist splint modified and parameters increased on L Bledsoe brace 30-120. Encouraged pt to complete frequent B finger ROM. Encourage pt to self feed using AE. Will continue to follow to facilitate D/C home with [facility] services.    Follow Up Recommendations  Home health OT;Supervision/Assistance - 24 hour    Barriers to Discharge  Other (comment) (unsure of caregiver support)    Equipment Recommendations  Other (comment)    Recommendations for Other Services    Frequency Min 3X/week   Progress towards OT Goals Progress towards OT goals: Progressing toward goals  Plan Discharge plan remains appropriate    Precautions / Restrictions Precautions Precautions: Fall Precaution Comments: cannot see out of L eye Required Braces or Orthoses: Other Brace/Splint;Sling Other Brace/Splint: Bledsoe Brace on Lt Elbow and splint on Rt wrist  Restrictions Weight Bearing Restrictions: Yes RUE Weight Bearing: Non weight bearing LUE Weight Bearing: Non weight bearing   Pertinent Vitals/Pain stable    ADL  Eating/Feeding: Maximal assistance Grooming: +1 Total assistance Where Assessed - Grooming: Supported sitting Upper Body Bathing: +1 Total assistance Where Assessed - Upper Body Bathing: Unsupported sitting Lower Body Bathing: +1 Total assistance Where Assessed -  Lower Body Bathing: Supported sit to stand Upper Body Dressing: +1 Total assistance Where Assessed - Upper Body Dressing: Supported sit to stand Lower Body Dressing: +1 Total assistance Where Assessed - Lower Body Dressing: Supported sit to Pharmacist, hospital: Minimal Dentist Method: Sit to stand;Stand pivot Toileting - Clothing Manipulation and Hygiene: +1 Total assistance Where Assessed - Glass blower/designer Manipulation and Hygiene: Sit to stand from 3-in-1 or toilet Equipment Used: Gait belt Transfers/Ambulation Related to ADLs: Min A ADL Comments: tional positionoing of R hand.     OT Diagnosis: Generalized weakness;Disturbance of vision;Acute pain  OT Problem List: Decreased strength;Decreased range of motion;Decreased activity tolerance;Impaired vision/perception;Decreased coordination;Decreased knowledge of use of DME or AE;Decreased knowledge of precautions;Impaired sensation;Impaired UE functional use;Pain;Increased edema OT Treatment Interventions: Self-care/ADL training;Therapeutic exercise;Energy conservation;DME and/or AE instruction;Splinting;Therapeutic activities;Visual/perceptual remediation/compensation;Patient/family education   OT Goals(current goals can now be found in the care plan section) Acute Rehab OT Goals Patient Stated Goal: go home OT Goal Formulation: With patient Time For Goal Achievement: 11/16/13 Potential to Achieve Goals: Good ADL Goals Pt Will Perform Eating: with supervision;with set-up;sitting Pt Will Perform Grooming: with set-up;with supervision;sitting;with adaptive equipment Pt Will Perform Upper Body Bathing: with min assist;with caregiver independent in assisting;sitting Pt Will Perform Lower Body Bathing: with min assist;with caregiver independent in assisting Pt Will Perform Upper Body Dressing: with min assist;with caregiver independent in assisting;sitting Pt Will Transfer to Toilet: with modified  independence;ambulating Pt Will Perform Toileting - Clothing Manipulation and hygiene: with modified independence;with adaptive equipment;sit to/from stand  Visit Information  Last OT Received On: 11/03/13 Assistance Needed: +1 Reason for Co-Treatment: Complexity of the patient's impairments (multi-system involvement) PT goals addressed during session: Mobility/safety  with mobility;Balance History of Present Illness: Spanish speaking male with two story fall yesterday.   Plastic surgery consulted for multiple facial fractures. 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 Did not wear glasses or have vision problems prior. Notes double vision when looking to sides. pt is s/p ORIF Rt wrist.     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 Communication: Prefers language other than English Dominant Hand: Right    Cognition  Cognition Arousal/Alertness: Awake/alert Behavior During Therapy: WFL for tasks assessed/performed Overall Cognitive Status: Within Functional Limits for tasks assessed Difficult to assess due to: Non-English speaking    Mobility  Bed Mobility Overal bed mobility: Needs Assistance Bed Mobility: Supine to Sit Supine to sit: Min assist General bed mobility comments: (A) to elevate trunk due to NWB through bil UEs Transfers Overall transfer level: Needs assistance Equipment used: 2 person hand held assist Transfers: Sit to/from Stand Sit to  Stand: Min assist Stand pivot transfers: Min assist General transfer comment: pt supported bilaterally for safety due to NWB status through bil UEs; + sway and slightly unsteady initally; pt c/o dizziness     Exercises  Other Exercises Other Exercises: increased parameters of Bledsoe brace @ 30-120 Other Exercises: nsg educatedon limitations and how to assist with feeding Other Exercises: A/AAROM wrist/hand.L Other Exercises: R gidit ROM shoulder ROM Other Exercises: BUE elevated on 2-3 pillows   Balance Balance Overall balance assessment: Needs assistance Sitting-balance support: Feet supported;No upper extremity supported Sitting balance-Leahy Scale: Good Standing balance support: During functional activity;No upper extremity supported Standing balance-Leahy Scale: Fair  End of Session OT - End of Session Equipment Utilized During Treatment: Other (comment) (AE) Activity Tolerance: Patient tolerated treatment well Patient left: in bed;with call bell/phone within reach Nurse Communication: Mobility status;Patient requests pain meds;Precautions;Weight bearing status;Other (comment) (AE)  GO     Roselle Norton,HILLARY 11/03/2013, 6:04 PM [facility] Kamrin Sibley, OTR/L  [phone] 11/03/2013

## 2013-11-03 NOTE — Progress Notes (Signed)
Orthopedic Tech Progress Note Patient Details:  Tommy Lee 01/03/1971 [id number] Original volar splint placed on during surgery extended past PIP joints impeding movement of fingers and thumb. Old splint removed and replaced with new volar splint made below crease of fingers and cut at thumb to allow for full ROM. Nurse tested patient's finger ROM by trying to have him touch each digit to thumb. Patient able to connect digits two and three with thumb but did not with four and five. Decrease in ROM to four and five may be caused by the amount of swelling in fingers. Application tolerated well.   Ortho Devices Type of Ortho Device: Volar splint;Ace wrap Ortho Device/Splint Location: Right UE Ortho Device/Splint Interventions: Removal;Application   [place] R Thompson 11/03/2013, 4:04 PM

## 2013-11-03 NOTE — Progress Notes (Signed)
Patient ID: Tommy Lee, male   DOB: 07/25/1971, [age] y.o.   MRN: [id number]   LOS: 3 days   Subjective: Language barrier. Says pain meds are working. Denies N/V, N/T.   Objective: Vital signs in last 24 hours: Temp:  [98.2 F (36.8 C)-99.8 F (37.7 C)] 98.2 F (36.8 C) (02/06 0333) Pulse Rate:  [63-108] 63 (02/06 0700) Resp:  [15-23] 19 (02/06 0700) BP: (123-150)/(54-75) 132/55 mmHg (02/06 0700) SpO2:  [97 %-100 %] 100 % (02/06 0700) Last BM Date: 10/31/13   Laboratory  CBC  Recent Labs  11/02/13 0222 11/02/13 1340 11/03/13 0220  WBC 13.2*  --  11.1*  HGB 7.5* 7.1* 6.9*  HCT 21.7* 20.0* 19.9*  PLT 153  --  147*   BMET  Recent Labs  11/02/13 0222 11/03/13 0220  NA 138 139  K 3.8 3.9  CL 103 103  CO2 24 24  GLUCOSE 133* 111*  BUN 21 11  CREATININE 0.85 0.72  CALCIUM 7.7* 8.0*    Physical Exam General appearance: alert and no distress Resp: clear to auscultation bilaterally Cardio: regular rate and rhythm GI: normal findings: bowel sounds normal and soft, non-tender Extremities: NVI   Assessment/Plan: Fall  Multiple facial FXs - per Dr. Leta Baptisthimmappa  Anterior and posterior table frontal sinus FXs with pneumocephalus - ? CSF leak, bedrest per Dr. Conchita ParisNundkumar for 48h, ends today at 1100. Will have PT/OT resume, plan lumbar drain if persists.  OU optic nerve injury - F/U with Dr. Vonna KotykBevis after D/C  L intracranial ICA dissection - ASA per Dr. Conchita ParisNundkumar  Open R wrist and L elbow FXs s/p ORIF -- NWB RUE, limited LUE  L renal contusion - urine appears clear  ABL anemia - Appears to have stabilized around 7, check tomorrow. FEN - Orals for pain, HR seems stable, will d/c scheduled lopressor VTE -- SCD's Dispo - Continue ICU with possible CSF leak    Freeman CaldronMichael J. Arliss Frisina, PA-C Pager: [phone] General Trauma PA Pager: [phone]   11/03/2013

## 2013-11-03 NOTE — Progress Notes (Signed)
No issues overnight. Pt denies any c/o. Has been sitting upright in bedside chair for >1 hr without drainage from nose/ear  EXAM:  BP 171/74  Pulse 110  Temp(Src) 97.8 F (36.6 C) (Oral)  Resp 17  Ht 5\' 10"  (1.778 m)  Wt 79.9 kg (176 lb 2.4 oz)  BMI 25.27 kg/m2  SpO2 97%  Awake, alert, oriented  Speech fluent, appropriate in spanish 5/5 BUE/BLE   IMPRESSION:  43 y.o. male s/p fall with skull base fractures - initial CSF rhinorrhea 2 days ago, currently no leak after 48 flat. - Non-flow limiting LICA dissection  PLAN: - Cont mobilization as tolerated and monitor for CSF leak. - Cont daily ASA 325mg  for LICA dissection

## 2013-11-03 NOTE — Progress Notes (Signed)
Occupational Therapy Evaluation Patient Details Name: Tommy Lee MRN: [id number] DOB: October 04, 1970 Today's Date: 11/03/2013 Time: [id number] OT Time Calculation (min): 49 min  OT Assessment / Plan / Recommendation History of present illness Spanish speaking male with two story fall yesterday.   Plastic surgery consulted for multiple facial fractures. 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 Did not wear glasses or have vision problems prior. Notes double vision when looking to sides. pt is s/p ORIF Rt wrist.    Clinical Impression   Pt seen as co treat with PT. Sat EOB without any evidence of CSF leak. Pt c/o dizziness. See vitals. Elevated BP. Pt moving well, but is limited by UE involvement/immobilization. Will contact Dr. Eulah PontMurphy regarding modifying R wrist splint and increasing parameters for Bledsoe brace in order [facility]. Pt will only have assistance at night, as his caretakers work during the day. Interpreter used during session. Also, pt states his vision improves when L eye is occluded. rec pt wear eye patch.  Will see again to increase independence with feeding.    OT Assessment  Patient needs continued OT Services    Follow Up Recommendations  Home health OT;Supervision/Assistance - 24 hour    Barriers to Discharge Other (comment) (unsure of caregiver support)    Equipment Recommendations  Other (comment) (AE)    Recommendations for Other Services    Frequency  Min 3X/week    Precautions / Restrictions Precautions Precautions: Fall Precaution Comments: cannot see out of L eye Required Braces or Orthoses: Other Brace/Splint;Sling Other Brace/Splint: Bledsoe Brace on Lt Elbow and splint on Rt wrist  Restrictions Weight Bearing Restrictions: Yes RUE Weight Bearing: Non weight bearing LUE Weight Bearing: Non weight bearing   Pertinent  Vitals/Pain See vitals. elvated BO    ADL  Eating/Feeding: Maximal assistance Grooming: +1 Total assistance Where Assessed - Grooming: Supported sitting Upper Body Bathing: +1 Total assistance Where Assessed - Upper Body Bathing: Unsupported sitting Lower Body Bathing: +1 Total assistance Where Assessed - Lower Body Bathing: Supported sit to stand Upper Body Dressing: +1 Total assistance Where Assessed - Upper Body Dressing: Supported sit to stand Lower Body Dressing: +1 Total assistance Where Assessed - Lower Body Dressing: Supported sit to Pharmacist, hospitalstand Toilet Transfer: Minimal Dentistassistance Toilet Transfer Method: Sit to stand;Stand pivot Toileting - Clothing Manipulation and Hygiene: +1 Total assistance Where Assessed - Glass blower/designerToileting Clothing Manipulation and Hygiene: Sit to stand from 3-in-1 or toilet Equipment Used: Gait belt Transfers/Ambulation Related to ADLs: Min A ADL Comments: total A at this time    OT Diagnosis: Generalized weakness;Disturbance of vision;Acute pain  OT Problem List: Decreased strength;Decreased range of motion;Decreased activity tolerance;Impaired vision/perception;Decreased coordination;Decreased knowledge of use of DME or AE;Decreased knowledge of precautions;Impaired sensation;Impaired UE functional use;Pain;Increased edema OT Treatment Interventions: Self-care/ADL training;Therapeutic exercise;Energy conservation;DME and/or AE instruction;Splinting;Therapeutic activities;Visual/perceptual remediation/compensation;Patient/family education   OT Goals(Current goals can be found in the care plan section) Acute Rehab OT Goals Patient Stated Goal: none stated today OT Goal Formulation: Patient unable to participate in goal setting Time For Goal Achievement: 11/16/13 Potential to Achieve Goals: Good ADL Goals Pt Will Perform Eating: with supervision;with set-up;sitting Pt Will Perform Grooming: with set-up;with supervision;sitting;with adaptive equipment Pt Will  Perform Upper Body Bathing: with min assist;with caregiver independent in assisting;sitting Pt Will Perform Lower Body Bathing: with min assist;with caregiver independent in assisting Pt Will Perform Upper Body  Dressing: with min assist;with caregiver independent in assisting;sitting Pt Will Transfer to Toilet: with modified independence;ambulating Pt Will Perform Toileting - Clothing Manipulation and hygiene: with modified independence;with adaptive equipment;sit to/from stand  Visit Information  Last OT Received On: 11/03/13 Assistance Needed: +1 Reason for Co-Treatment: Complexity of the patient's impairments (multi-system involvement) PT goals addressed during session: Mobility/safety with mobility;Balance History of Present Illness: Spanish speaking male with two story fall yesterday.   Plastic surgery consulted for multiple facial fractures. 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 Did not wear glasses or have vision problems prior. Notes double vision when looking to sides. pt is s/p ORIF Rt wrist.        Prior Functioning     Home Living Family/patient expects to be discharged to:: Private residence Living Arrangements: Alone Available Help at Discharge: Family;Friend(s);Available 24 hours/day Type of Home: Apartment Home Layout: One level Home Equipment: None Additional Comments: pt reports he would have friends (A) at all times as needed upon D/C  Prior Function Level of Independence: Independent Comments:  (worked as Education administrator) Musician: Prefers language other than English Dominant Hand: Right         Vision/Perception Vision - History Baseline Vision: No visual deficits Patient Visual Report: Blurring of vision Vision - Assessment Eye Alignment: Impaired (comment) Vision Assessment: Vision impaired - to be further tested in functional  context Additional Comments: not seeing out of L eye   Cognition  Cognition Arousal/Alertness: Lethargic;Suspect due to medications Behavior During Therapy: Flat affect Overall Cognitive Status: Difficult to assess Difficult to assess due to: Non-English speaking    Extremity/Trunk Assessment Upper Extremity Assessment Upper Extremity Assessment: Defer to OT evaluation RUE Deficits / Details: R wrist in splint above MP joints. Fingers edematous.ROM shoulder WFL. limited sup/pronation  RUE Coordination: decreased fine motor;decreased gross motor LUE Deficits / Details: Pt with hard splint under bedsoe brace with brace @ 30 degrees extension; weak composite flexion/extension (weak wrist; diffculty extending against gravity) LUE Sensation:  (unable to assess due to language) LUE Coordination: decreased fine motor;decreased gross motor Lower Extremity Assessment Lower Extremity Assessment: Overall WFL for tasks assessed Cervical / Trunk Assessment Cervical / Trunk Assessment: Normal     Mobility Bed Mobility Overal bed mobility: Needs Assistance Bed Mobility: Supine to Sit Supine to sit: Min assist General bed mobility comments: (A) to elevate trunk due to NWB through bil UEs Transfers Overall transfer level: Needs assistance Equipment used: 2 person hand held assist Transfers: Sit to/from Stand Sit to Stand: Min assist Stand pivot transfers: Min assist General transfer comment: pt supported bilaterally for safety due to NWB status through bil UEs; + sway and slightly unsteady initally; pt c/o dizziness      Exercise Other Exercises Other Exercises: L elbow AAROM within pain tolerance @ 30 degrees total Other Exercises: L  Other Exercises: A/AAROM wrist/hand.L Other Exercises: R gidit ROM shoulder ROM Other Exercises: BUE elevated on 2-3 pillows   Balance Balance Overall balance assessment: Needs assistance Sitting-balance support: Feet supported;No upper extremity  supported Sitting balance-Leahy Scale: Good Standing balance support: During functional activity;No upper extremity supported Standing balance-Leahy Scale: Fair   End of Session OT - End of Session Equipment Utilized During Treatment: Gait belt Activity Tolerance: Patient limited by pain;Treatment limited secondary to medical complications (Comment) (bed level eval for BUE) Patient left: in bed;with call bell/phone within reach;with nursing/sitter in room Nurse  Communication: Precautions;Weight bearing status;Other (comment);Patient requests pain meds (BUE protocol)  GO     Nella Botsford,HILLARY 11/03/2013, 5:48 PM [facility], OTR/L  [phone] 11/03/2013

## 2013-11-04 LAB — CBC
HEMATOCRIT: 21.7 % — AB (ref 39.0–52.0)
Hemoglobin: 7.7 g/dL — ABNORMAL LOW (ref 13.0–17.0)
MCH: 32.8 pg (ref 26.0–34.0)
MCHC: 35.5 g/dL (ref 30.0–36.0)
MCV: 92.3 fL (ref 78.0–100.0)
Platelets: 182 10*3/uL (ref 150–400)
RBC: 2.35 MIL/uL — ABNORMAL LOW (ref 4.22–5.81)
RDW: 13.3 % (ref 11.5–15.5)
WBC: 9.6 10*3/uL (ref 4.0–10.5)

## 2013-11-04 NOTE — Progress Notes (Signed)
Subjective: 4 Days Post-Op Procedure(s) (LRB): OPEN REDUCTION INTERNAL FIXATION (ORIF) WRIST FRACTURE (Right) Patient reports pain as 2 on 0-10 scale.  Patient reports minimal pain.  No nausea/vomiting.    Objective: Vital signs in last 24 hours: Temp:  [97.7 F (36.5 C)-99 F (37.2 C)] 97.7 F (36.5 C) (02/07 0842) Pulse Rate:  [76-128] 99 (02/07 1100) Resp:  [11-24] 12 (02/07 1100) BP: (128-191)/(53-122) 180/79 mmHg (02/07 1100) SpO2:  [96 %-100 %] 99 % (02/07 1100)  Intake/Output from previous day: 02/06 0701 - 02/07 0700 In: 200 [P.O.:120; I.V.:80] Out: 1600 [Urine:1600] Intake/Output this shift: Total I/O In: 240 [P.O.:240] Out: 125 [Urine:125]   Recent Labs  11/02/13 0222 11/02/13 1340 11/03/13 0220 11/04/13 0305  HGB 7.5* 7.1* 6.9* 7.7*    Recent Labs  11/03/13 0220 11/04/13 0305  WBC 11.1* 9.6  RBC 2.15* 2.35*  HCT 19.9* 21.7*  PLT 147* 182    Recent Labs  11/02/13 0222 11/03/13 0220  NA 138 139  K 3.8 3.9  CL 103 103  CO2 24 24  BUN 21 11  CREATININE 0.85 0.72  GLUCOSE 133* 111*  CALCIUM 7.7* 8.0*   No results found for this basename: LABPT, INR,  in the last 72 hours  Neurologically intact ABD soft Neurovascular intact Sensation intact distally Intact pulses distally Dorsiflexion/Plantar flexion intact Compartment soft Good ROM about the right elbow and fingers GOod ROM about the left fingers Sensation intact bilateral upper extremities  Assessment/Plan: 4 Days Post-Op Procedure(s) (LRB): OPEN REDUCTION INTERNAL FIXATION (ORIF) WRIST FRACTURE (Right) Advance diet Up with therapy Continue with right wrist splint Continue with left arm bledsoe brace at all times Continue plan per trauma  ANTON, M. LINDSEY 11/04/2013, 11:32 AM

## 2013-11-04 NOTE — Progress Notes (Signed)
Physical Therapy Treatment Patient Details Name: Tommy Lee MRN: [id number] DOB: 03/18/1971 Today's Date: 11/04/2013 Time: [id number] PT Time Calculation (min): 24 min  PT Assessment / Plan / Recommendation  History of Present Illness Spanish speaking male with two story fall yesterday.   Plastic surgery consulted for multiple facial fractures. 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 Did not wear glasses or have vision problems prior. Notes double vision when looking to sides. pt is s/p ORIF Rt wrist.    PT Comments   Pt mobility limited by pain, increased HR and increased BP.  Pt able to ambulate to/from BR during Rx today.  Interpreter utilized during Rx today.   Follow Up Recommendations  Home health PT;Supervision/Assistance - 24 hour     Does the patient have the potential to tolerate intense rehabilitation     Barriers to Discharge        Equipment Recommendations  None recommended by PT    Recommendations for Other Services    Frequency Min 4X/week   Progress towards PT Goals Progress towards PT goals: Progressing toward goals  Plan Current plan remains appropriate    Precautions / Restrictions Precautions Precautions: Fall Required Braces or Orthoses: Sling;Other Brace/Splint Other Brace/Splint: bledsoe brace L elbow and splint R wrist Restrictions Weight Bearing Restrictions: Yes RUE Weight Bearing: Non weight bearing LUE Weight Bearing: Non weight bearing   Pertinent Vitals/Pain 5/10    Mobility  Bed Mobility Overal bed mobility: Needs Assistance Bed Mobility: Supine to Sit Supine to sit: Min assist Sit to supine: Min assist General bed mobility comments: (A) to elevate trunk due to NWB through bil UEs Transfers Overall transfer level: Needs assistance Equipment used: 1 person hand held assist Transfers: Sit to/from [employer] CorporationStand;Stand Pivot Transfers Sit  to Stand: Min assist Stand pivot transfers: Min assist Ambulation/Gait Ambulation/Gait assistance: Min assist Ambulation Distance (Feet): 15 Feet Assistive device: 1 person hand held assist Gait Pattern/deviations: Wide base of support Gait velocity interpretation: Below normal speed for age/gender General Gait Details: increased HR (147 with standing/gait) and increased BP, pt reports pain/pressure in head    Exercises Other Exercises Other Exercises: increased parameters of Bledsoe brace @ 30-120 (actively moving L elbow within pain tolerance) Other Exercises: nsg educatedon limitations and how to assist with feeding Other Exercises: A/AAROM wrist/hand.L Other Exercises: R gidit ROM shoulder ROM (Able to cmoplete composite flexion/extensio. Able to oppose ) Other Exercises: BUE elevated on 2-3 pillows   PT Diagnosis:    PT Problem List:   PT Treatment Interventions:     PT Goals (current goals can now be found in the care plan section) Acute Rehab PT Goals Patient Stated Goal: go home  Visit Information  Last PT Received On: 11/04/13 Assistance Needed: +1 History of Present Illness: Spanish speaking male with two story fall yesterday.   Plastic surgery consulted for multiple facial fractures. 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 Did not wear glasses or have vision problems prior. Notes double vision when looking to sides. pt is s/p ORIF Rt wrist.     Subjective Data  Patient Stated Goal: go home   Cognition  Cognition Arousal/Alertness: Awake/alert Behavior During Therapy: WFL for tasks assessed/performed Overall Cognitive Status: Within Functional Limits for tasks assessed Difficult to assess due to: Non-English speaking (interpreter utilized)  Balance  Balance Sitting-balance support: Feet supported;No upper extremity supported Sitting balance-Leahy Scale:  Good Standing balance support: During functional activity Standing balance-Leahy Scale: Fair  End of Session PT - End of Session Equipment Utilized During Treatment: Gait belt Activity Tolerance: Patient limited by pain Patient left: in chair;with call bell/phone within reach;with family/visitor present Nurse Communication: Mobility status   GP     Ilda Foil 11/04/2013, 11:47 AM  Aida Raider, PT  Office # [phone] Pager [phone]

## 2013-11-04 NOTE — Progress Notes (Signed)
Interpreter Wyvonnia DuskyGraciela Namihira for PT Tommy FailWendy

## 2013-11-04 NOTE — Progress Notes (Signed)
Patient ID: Tommy Lee, male   DOB: [birth year], [age] y.o.   MRN: [id number] Alert, oriented. Out of bed currently. To do some ambulation today. No drainage of any CSF from the nose.  Condition remains stable.

## 2013-11-04 NOTE — Progress Notes (Signed)
I participated in the care of this patient and agree with the above history, physical and evaluation. I performed a review of the history and a physical exam as detailed   Nabiha Planck Daniel Chenika Nevils MD  

## 2013-11-04 NOTE — Progress Notes (Signed)
Occupational Therapy Treatment Patient Details Name: Filemon Breton MRN: [id number] DOB: June 07, 1971 Today's Date: 11/04/2013 Time: 0910-1009 OT Time Calculation (min): 59 min  OT Assessment / Plan / Recommendation  History of present illness Spanish speaking male with two story fall yesterday.   Plastic surgery consulted for multiple facial fractures. 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 Did not wear glasses or have vision problems prior. Notes double vision when looking to sides. pt is s/p ORIF Rt wrist.    OT comments  Making excellent progress. Increased use of B UE slowly. Discussed with staff. Needs to be seen everyday due to financial situation and will need to be as independent as possible prior to D/C as pt will be by himself during the day.  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   Follow Up Recommendations  Home health OT;Supervision/Assistance - 24 hour    Barriers to Discharge       Equipment Recommendations  Other (comment)    Recommendations for Other Services    Frequency Min 3X/week   Progress towards OT Goals Progress towards OT goals: Progressing toward goals  Plan Discharge plan remains appropriate    Precautions / Restrictions Restrictions Weight Bearing Restrictions: Yes RUE Weight Bearing: Non weight bearing LUE Weight Bearing: Non weight bearing LUE Partial Weight Bearing Percentage or Pounds: 5   Pertinent Vitals/Pain HR increased to 150s with standing. Elevated BP. nsg aware    ADL  Eating/Feeding: Minimal assistance;Set up (using AE) Grooming: Teeth care;Minimal assistance;Set up;Other (comment) (AE. vc to use L UE as assist) Transfers/Ambulation Related to ADLs: Min A ADL Comments: much improvement today    OT Diagnosis:    OT Problem List:   OT Treatment Interventions:     OT Goals(current goals can now be  found in the care plan section) Acute Rehab OT Goals Patient Stated Goal: go home OT Goal Formulation: With patient Time For Goal Achievement: 11/16/13 Potential to Achieve Goals: Good ADL Goals Pt Will Perform Eating: with supervision;with set-up;sitting Pt Will Perform Grooming: with set-up;with supervision;sitting;with adaptive equipment Pt Will Perform Upper Body Bathing: with min assist;with caregiver independent in assisting;sitting Pt Will Perform Lower Body Bathing: with min assist;with caregiver independent in assisting Pt Will Perform Upper Body Dressing: with min assist;with caregiver independent in assisting;sitting Pt Will Transfer to Toilet: with modified independence;ambulating Pt Will Perform Toileting - Clothing Manipulation and hygiene: with modified independence;with adaptive equipment;sit to/from stand  Visit Information  Last OT Received On: 11/04/13 Assistance Needed: +1 History of Present Illness: Spanish speaking male with two story fall yesterday.   Plastic surgery consulted for multiple facial fractures. 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 Did not wear glasses or have vision problems prior. Notes double vision when looking to sides. pt is s/p ORIF Rt wrist.     Subjective Data      Prior Functioning       Cognition  Cognition Arousal/Alertness: Awake/alert Behavior During Therapy: WFL for tasks assessed/performed Overall Cognitive Status: Within Functional Limits for tasks assessed    Mobility  Bed Mobility Overal bed mobility: Needs Assistance Bed Mobility: Supine to Sit Supine to sit: Min assist Sit to supine: Min assist General bed mobility comments: (A) to elevate trunk due to NWB through bil  UEs Transfers Overall transfer level: Needs assistance Equipment used: 1 person hand held assist Transfers: Sit to/from [employer] CorporationStand;Stand Pivot  Transfers Sit to Stand: Min assist Stand pivot transfers: Min assist    Exercises  Other Exercises Other Exercises: increased parameters of Bledsoe brace @ 30-120 (actively moving L elbow within pain tolerance) Other Exercises: nsg educatedon limitations and how to assist with feeding Other Exercises: A/AAROM wrist/hand.L Other Exercises: R gidit ROM shoulder ROM (Able to cmoplete composite flexion/extensio. Able to oppose ) Other Exercises: BUE elevated on 2-3 pillows   Balance  minguard  End of Session OT - End of Session Activity Tolerance: Patient tolerated treatment well Patient left: in bed;with call bell/phone within reach Nurse Communication: Mobility status;Patient requests pain meds;Precautions;Weight bearing status;Other (comment)  GO     Chisom Aust,HILLARY 11/04/2013, 10:44 AM Luisa DagoHilary Coree Brame, OTR/L  [phone] 11/04/2013

## 2013-11-04 NOTE — Evaluation (Signed)
Speech Language Pathology Evaluation Patient Details Name: Tommy Lee MRN: [id number] DOB: 1970-12-01 Today's Date: 11/04/2013 Time: 1415-1440 SLP Time Calculation (min): 25 min  Problem List:  Patient Active Problem List   Diagnosis Date Noted  . Fall 11/01/2013  . Traumatic pneumocephalus 11/01/2013  . Left elbow fracture 11/01/2013  . Open fracture of right wrist 11/01/2013  . Concussion with loss of consciousness 11/01/2013  . Acute respiratory failure 11/01/2013  . Carotid artery injury 11/01/2013  . Renal contusion 11/01/2013  . Acute blood loss anemia 11/01/2013  . Multiple facial fractures 10/31/2013   Past Medical History: History reviewed. No pertinent past medical history. Past Surgical History: History reviewed. No pertinent past surgical history. 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 He had a brief loss of consciousness. [facility]. He was made a level 2 trauma after arrival by the emergency department physician. His workup demonstrated open right distal radius fracture, Multiple facial fractures including both tables of the frontal sinus with associated pneumocephalus, and left upper quadrant retroperitoneal stranding around the tail the pancreas.  Assessment / Plan / Recommendation Clinical Impression   Cognitive Linguistic Evaluation completed with  interpretor present. Results indicate moderate deficits in areas of sustained attention, immediate and delayed recall of new information, and awareness of deficits.  Problem solving functional in current environment with limited distractions.  Recommend further ST in acute care setting to address deficits.  Further ST at next level of care to be determined.      SLP Assessment  Patient needs continued Speech Lanaguage Pathology Services    Follow Up Recommendations   (TBD)    Frequency and  Duration min 2x/week  2 weeks      SLP Goals  SLP Goals Potential to Achieve Goals: Good Potential Considerations: Previous level of function;Financial resources  SLP Evaluation Prior Functioning  Cognitive/Linguistic Baseline: Information not available Type of Home: Apartment  Lives With: Friend(s) Available Help at Discharge: Friend(s);Available 24 hours/day Education: Some elementary Vocation: Full time employment   Cognition  Overall Cognitive Status: Impaired/Different from baseline Arousal/Alertness: Awake/alert Orientation Level: Oriented X4 Attention: Focused;Sustained Focused Attention: Impaired Focused Attention Impairment: Functional basic;Verbal complex Sustained Attention: Impaired Sustained Attention Impairment: Verbal basic;Functional basic Memory: Impaired Memory Impairment: Retrieval deficit;Decreased recall of new information Awareness: Impaired Awareness Impairment: Intellectual impairment;Emergent impairment Problem Solving: Impaired Problem Solving Impairment: Functional basic Executive Function: Reasoning Safety/Judgment: Appears intact    Comprehension  Auditory Comprehension Overall Auditory Comprehension: Appears within functional limits for tasks assessed Yes/No Questions: Within Functional Limits Commands: Within Functional Limits Visual Recognition/Discrimination Discrimination: Not tested Reading Comprehension Reading Status: Not tested    Expression Expression Primary Mode of Expression: Verbal Verbal Expression Overall Verbal Expression: Appears within functional limits for tasks assessed Written Expression Dominant Hand: Right Written Expression: Not tested   Oral / Motor Oral Motor/Sensory Function Overall Oral Motor/Sensory Function: Appears within functional limits for tasks assessed Motor Speech Overall Motor Speech: Appears within functional limits for tasks assessed   GO    Moreen FowlerKaren Zion Ta MS,  CCC-SLP [phone] [facility] Pun[facility] GordaDANKOF,[name] 11/04/2013, 2:57 PM

## 2013-11-04 NOTE — Progress Notes (Signed)
Trauma Service Note  Subjective: With interpreter pain is doing fine.  No more CSF leakage.  Complaining mostly of left arm pain.  Objective: Vital signs in last 24 hours: Temp:  [97.7 F (36.5 C)-99 F (37.2 C)] 97.7 F (36.5 C) (02/07 0842) Pulse Rate:  [76-128] 95 (02/07 0900) Resp:  [13-24] 19 (02/07 0900) BP: (128-191)/(53-122) 145/66 mmHg (02/07 0900) SpO2:  [96 %-100 %] 97 % (02/07 0900) Last BM Date: 10/31/13  Intake/Output from previous day: 02/06 0701 - 02/07 0700 In: 200 [P.O.:120; I.V.:80] Out: 1600 [Urine:1600] Intake/Output this shift: Total I/O In: 240 [P.O.:240] Out: 125 [Urine:125]  General: No acute distress  Lungs: Clear  Abd: Benign  Extremities: Splinted upper extrremities  Neuro: Intact, no CSF leakage.  Lab Results: CBC   Recent Labs  11/03/13 0220 11/04/13 0305  WBC 11.1* 9.6  HGB 6.9* 7.7*  HCT 19.9* 21.7*  PLT 147* 182   BMET  Recent Labs  11/02/13 0222 11/03/13 0220  NA 138 139  K 3.8 3.9  CL 103 103  CO2 24 24  GLUCOSE 133* 111*  BUN 21 11  CREATININE 0.85 0.72  CALCIUM 7.7* 8.0*   PT/INR No results found for this basename: LABPROT, INR,  in the last 72 hours ABG No results found for this basename: PHART, PCO2, PO2, HCO3,  in the last 72 hours  Studies/Results: No results found.  Anti-infectives: Anti-infectives   Start     Dose/Rate Route Frequency Ordered Stop   11/01/13 0600  ceFAZolin (ANCEF) IVPB 2 g/50 mL premix  Status:  Discontinued     2 g 100 mL/hr over 30 Minutes Intravenous On call to O.R. 10/31/13 1923 10/31/13 1955   10/31/13 2000  ceFAZolin (ANCEF) IVPB 2 g/50 mL premix     2 g 100 mL/hr over 30 Minutes Intravenous Every 6 hours 10/31/13 1923 11/01/13 0846   10/31/13 1315  clindamycin (CLEOCIN) IVPB 600 mg     600 mg 100 mL/hr over 30 Minutes Intravenous  Once 10/31/13 1313 10/31/13 1447   10/31/13 1230  ceFAZolin (ANCEF) IVPB 1 g/50 mL premix     1 g 100 mL/hr over 30 Minutes Intravenous   Once 10/31/13 1216 10/31/13 1311   10/31/13 1200  [MAR Hold]  ceFAZolin (ANCEF) 2 g in dextrose 5 % 50 mL IVPB     (On MAR Hold since 10/31/13 1457)   2 g 140 mL/hr over 30 Minutes Intravenous  Once 10/31/13 1156 10/31/13 1601      Assessment/Plan: s/p Procedure(s): OPEN REDUCTION INTERNAL FIXATION (ORIF) WRIST FRACTURE Transfer to 4N Continue therapy.  LOS: 4 days   Marta LamasJames O. Gae BonWyatt, III, MD, FACS [phone] Trauma Surgeon 11/04/2013

## 2013-11-04 NOTE — Progress Notes (Signed)
Interpreter Wyvonnia DuskyGraciela Namihira for OT [facility]

## 2013-11-05 NOTE — Progress Notes (Signed)
Trauma Service Note  Subjective: Patient still very sleepy. Will awaken and answer questions appropriately.  Objective: Vital signs in last 24 hours: Temp:  [97.7 F (36.5 C)-100.9 F (38.3 C)] 99.6 F (37.6 C) (02/08 0447) Pulse Rate:  [79-106] 106 (02/08 0447) Resp:  [11-20] 19 (02/08 0447) BP: (116-180)/(62-79) 139/69 mmHg (02/08 0447) SpO2:  [96 %-99 %] 99 % (02/08 0447) Weight:  [79.9 kg (176 lb 2.4 oz)] 79.9 kg (176 lb 2.4 oz) (02/07 1806) Last BM Date: 11/02/13  Intake/Output from previous day: 02/07 0701 - 02/08 0700 In: 720 [P.O.:720] Out: 1125 [Urine:1125] Intake/Output this shift:    General: No acute distress  Lungs: Clear  Abd: Benign  Extremities: No change  Neuro: Seems intact.    Lab Results: CBC   Recent Labs  11/03/13 0220 11/04/13 0305  WBC 11.1* 9.6  HGB 6.9* 7.7*  HCT 19.9* 21.7*  PLT 147* 182   BMET  Recent Labs  11/03/13 0220  NA 139  K 3.9  CL 103  CO2 24  GLUCOSE 111*  BUN 11  CREATININE 0.72  CALCIUM 8.0*   PT/INR No results found for this basename: LABPROT, INR,  in the last 72 hours ABG No results found for this basename: PHART, PCO2, PO2, HCO3,  in the last 72 hours  Studies/Results: No results found.  Anti-infectives: Anti-infectives   Start     Dose/Rate Route Frequency Ordered Stop   11/01/13 0600  ceFAZolin (ANCEF) IVPB 2 g/50 mL premix  Status:  Discontinued     2 g 100 mL/hr over 30 Minutes Intravenous On call to O.R. 10/31/13 1923 10/31/13 1955   10/31/13 2000  ceFAZolin (ANCEF) IVPB 2 g/50 mL premix     2 g 100 mL/hr over 30 Minutes Intravenous Every 6 hours 10/31/13 1923 11/01/13 0846   10/31/13 1315  clindamycin (CLEOCIN) IVPB 600 mg     600 mg 100 mL/hr over 30 Minutes Intravenous  Once 10/31/13 1313 10/31/13 1447   10/31/13 1230  ceFAZolin (ANCEF) IVPB 1 g/50 mL premix     1 g 100 mL/hr over 30 Minutes Intravenous  Once 10/31/13 1216 10/31/13 1311   10/31/13 1200  [MAR Hold]  ceFAZolin (ANCEF)  2 g in dextrose 5 % 50 mL IVPB     (On MAR Hold since 10/31/13 1457)   2 g 140 mL/hr over 30 Minutes Intravenous  Once 10/31/13 1156 10/31/13 1601      Assessment/Plan: s/p Procedure(s): OPEN REDUCTION INTERNAL FIXATION (ORIF) WRIST FRACTURE Need to work on appropriate placement  LOS: 5 days   Marta LamasJames O. Gae BonWyatt, III, MD, FACS [phone] Trauma Surgeon 11/05/2013

## 2013-11-05 NOTE — Progress Notes (Signed)
Patient ID: Tommy Lee, male   DOB: 14-Apr-1971, 43 y.o.   MRN: [id number] Alert awake oriented. Denies any CSF leakage from nose. Stable

## 2013-11-05 NOTE — Progress Notes (Signed)
Occupational Therapy Treatment Patient Details Name: Tommy Lee MRN: [id number] DOB: 01/03/1971 Today's Date: 11/05/2013 Time: [id number] OT Time Calculation (min): 64 min  OT Assessment / Plan / Recommendation  History of present illness Spanish speaking male with two story fall yesterday.   Plastic surgery consulted for multiple facial fractures. 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 Did not wear glasses or have vision problems prior. Notes double vision when looking to sides. pt is s/p ORIF Rt wrist.  Also sustained Lt. elbow fracture.  Lt UE in Bledsoe brace   OT comments  Pt progressing with BADLs slowly.  Interpreter present throughout session.  Pt clearly has cognitive deficits as he is unable to remember his injuries.  He asked that the splint/dressing be removed from his Rt hand because it is bothering him and in his way - he had no recollection that he fractured it and had surgery.  Given level of impairment bil. UEs and impact on his ability to perform BADLs, his cognitive deficits, and balance deficits, feel he would benefit from a brief CIR stay to maximize his safety and independence with BADLs before returning home. He will have likely have limited opportunity [facility] or [facility] therapies upon discharge.   Follow Up Recommendations  CIR    Barriers to Discharge       Equipment Recommendations  None recommended by OT (AE)    Recommendations for Other Services Rehab consult  Frequency Min 3X/week   Progress towards OT Goals Progress towards OT goals: Progressing toward goals  Plan Discharge plan needs to be updated    Precautions / Restrictions Precautions Precautions: Fall Precaution Comments: cannot see out of L eye Required Braces or Orthoses: Sling;Other Brace/Splint Other Brace/Splint: bledsoe brace L elbow and splint R wrist Restrictions Weight Bearing  Restrictions: Yes RUE Weight Bearing: Non weight bearing LUE Weight Bearing: Non weight bearing   Pertinent Vitals/Pain     ADL  Eating/Feeding: Minimal assistance (max encouragement to drink from cup) Where Assessed - Eating/Feeding: Bed level;Edge of bed Grooming: Wash/dry face;Minimal assistance (max encouragement) Where Assessed - Grooming: Supported sitting Toilet Transfer: Hydrographic surveyorMin guard Toilet Transfer Method: Sit to Baristastand Toilet Transfer Equipment: Comfort height toilet Toileting - Clothing Manipulation and Hygiene: Maximal assistance (maximal encouragement) Where Assessed - Toileting Clothing Manipulation and Hygiene: Standing Transfers/Ambulation Related to ADLs: min guard assist ADL Comments: Interpreter present.  Pt very resistive to activity.  He requested therapist wash his face, and when encouraged to attempt to do it for himself, he repetitively stated that he couldn't.  Pt became very frustrated with therapy attempt to encourage him to attempt.  Explained to pt in detail why this was important and why he needed to start working on performing basic tasks in hospital.  Pt states he has no assist at home besides his roommate. Pt with friend present for part of session who confirms that everyone works and pt will have very limited support at discharge.   Pt states that his Left arm splint/dressing gets in his way and requests that it be removed.  He had no recollection that he has broken it and underwent surgery.  Pt was able to maneuver gown when standing to urinate with mod A and max encouragement.  Bledsoe brace not aligned with joint and was readjusted    OT Diagnosis:    OT Problem List:   OT Treatment Interventions:  OT Goals(current goals can now be found in the care plan section) Acute Rehab OT Goals Time For Goal Achievement: 11/16/13 Potential to Achieve Goals: Good ADL Goals Pt Will Perform Eating: with supervision;with set-up;sitting Pt Will Perform Grooming: with  set-up;with supervision;sitting;with adaptive equipment Pt Will Perform Upper Body Bathing: with min assist;with caregiver independent in assisting;sitting Pt Will Perform Lower Body Bathing: with min assist;with caregiver independent in assisting Pt Will Perform Upper Body Dressing: with min assist;with caregiver independent in assisting;sitting Pt Will Transfer to Toilet: with modified independence;ambulating Pt Will Perform Toileting - Clothing Manipulation and hygiene: with modified independence;with adaptive equipment;sit to/from stand  Visit Information  Last OT Received On: 11/05/13 Assistance Needed: +1 History of Present Illness: Spanish speaking male with two story fall yesterday.   Plastic surgery consulted for multiple facial fractures. 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 Did not wear glasses or have vision problems prior. Notes double vision when looking to sides. pt is s/p ORIF Rt wrist.  Also sustained Lt. elbow fracture.  Lt UE in Bledsoe brace    Subjective Data      Prior Functioning       Cognition  Cognition Arousal/Alertness: Awake/alert Behavior During Therapy:  (irritable) Overall Cognitive Status: Impaired/Different from baseline Area of Impairment: Memory;Safety/judgement;Awareness;Problem solving Memory: Decreased short-term memory Safety/Judgement: Decreased awareness of deficits Problem Solving: Decreased initiation General Comments: Pt able to state that he fell, but unable to provide any details whatsoever about his injuries.  Did not recall that he had wrist fracture, nor was he able to recall that he has facial fractures and Lt. elbow fracture. Pt internally distracted    Mobility  Bed Mobility Overal bed mobility: Needs Assistance Bed Mobility: Supine to Sit;Sit to Supine Supine to sit: Min assist Sit to supine: Min assist General bed  mobility comments: Assist to lift trunk due NWB bil. UEs Transfers Overall transfer level: Needs assistance Transfers: Sit to/from Stand;Stand Pivot Transfers Sit to Stand: Min guard Stand pivot transfers: Min guard    Exercises  Other Exercises Other Exercises: A/AAROM bil hands - max encouragement.  Other Exercises: elbow flex Rt. x 10 reps with max encouragement.  Rt. shoulder x 4 reps with max encouragement and then refused further  Other Exercises: Lt UE A/AAROM - pt refused    Balance Balance Overall balance assessment: Needs assistance Sitting-balance support: No upper extremity supported Sitting balance-Leahy Scale: Good Standing balance support: During functional activity Standing balance-Leahy Scale: Fair  End of Session OT - End of Session Equipment Utilized During Treatment: Other (comment) (bledso brace; AE) Activity Tolerance: Patient limited by pain;Patient limited by fatigue Patient left: in bed;with call bell/phone within reach;with family/visitor present Nurse Communication: Mobility status;Other (comment);Patient requests pain meds (status of session)  GO     Siddhartha Hoback M 11/05/2013, 6:47 PM

## 2013-11-06 ENCOUNTER — Encounter (HOSPITAL_COMMUNITY): Payer: Self-pay | Admitting: Orthopedic Surgery

## 2013-11-06 MED ORDER — POLYETHYLENE GLYCOL 3350 17 G PO PACK
17.0000 g | PACK | Freq: Every day | ORAL | Status: DC
  Administered 2013-11-06: 17 g via ORAL
  Filled 2013-11-06: qty 1

## 2013-11-06 MED ORDER — ASPIRIN 325 MG PO TBEC: 325.0000 mg | DELAYED_RELEASE_TABLET | Freq: Every day | ORAL | Status: AC

## 2013-11-06 MED ORDER — DOCUSATE SODIUM 100 MG PO CAPS
100.0000 mg | ORAL_CAPSULE | Freq: Two times a day (BID) | ORAL | Status: DC
  Administered 2013-11-06: 100 mg via ORAL
  Filled 2013-11-06: qty 1

## 2013-11-06 MED ORDER — OXYCODONE-ACETAMINOPHEN 10-325 MG PO TABS: 0.5000 | ORAL_TABLET | ORAL | Status: DC | PRN

## 2013-11-06 NOTE — Progress Notes (Signed)
Need to clarify home situation prior to going home.  This patient has been seen and I agree with the findings and treatment plan.  Marta LamasJames O. Gae BonWyatt, III, MD, FACS [phone] (pager) [phone] (direct pager) Trauma Surgeon

## 2013-11-06 NOTE — Discharge Instructions (Signed)
Cuidados del yeso o la férula °(Cast or Splint Care) °El yeso y las férulas sostienen los miembros lesionados y evitan que los huesos se muevan hasta que se curen. Es importante que cuide el yeso o la férula cuando se encuentre en su casa.   °INSTRUCCIONES PARA EL CUIDADO EN EL HOGAR °· Mantenga el yeso o la férula al descubierto durante el tiempo de secado. Puede tardar entre 24 y 48 horas para secarse si está hecho de yeso. La fibra de vidrio se seca en menos de 1 hora. °· No apoye el yeso sobre nada que sea más duro que una almohada durante 24 horas. °· No aplique peso sobre el miembro lesionado ni haga presión sobre el yeso hasta que el médico lo autorice. °· Mantenga el yeso o la férula secos. Al mojarse pueden perder la forma y podría ocurrir que no soporten el miembro. Un yeso mojado que ha perdido su forma puede presionar de manera peligrosa en la piel al secarse. Además, la piel mojada podría infectarse. °· Cubra el yeso o la férula con una bolsa plástica cuando tome un baño o cuando salga al exterior en días de lluvia o nieve. Si el yeso está colocado sobre el tronco, deberá bañarse pasando una esponja por el cuerpo, hasta que se lo retiren. °· Si el yeso se moja, séquelo con una toalla o con un secador de cabello sólo en posición de aire frío. °· Mantenga el yeso o la férula limpios. Si el yeso se ensucia, puede limpiarlo con un paño húmedo. °· No coloque objetos extraños duros o blandos debajo del yeso o cabestrillo, como algodón, papel higiénico, loción o talco. °· No se rasque la piel por debajo del molde con ningún objeto. Podría quedar adherido al yeso. Además, el rascado puede causar una infección. Si siente picazón, use un secador de cabello con aire frío sobre la zona que pica para aliviar las molestias. °· No recorte ni quite el relleno acolchado que se encuentra debajo del yeso. °· Ejercite todas las articulaciones que no estén inmovilizadas por el yeso o férula. Por ejemplo, si tiene un yeso  largo de pierna, ejercite la articulación de la cadera y los dedos de los pies. Si tiene un brazo enyesado o entablillado, ejercite el hombro, el codo, el pulgar y los dedos de la mano. °· Eleve el brazo o la pierna sobre 1 ó 2 almohadas durante los primeros 3 días para disminuir la hinchazón y el dolor.Es mejor si puede elevar cómodamente el yeso para que quede más arriba del nivel del corazón. °SOLICITE ATENCIÓN MÉDICA SI:  °· El yeso o la férula se quiebran. °· Siente que el yeso o la férula están muy apretados o muy flojos. °· Tiene una picazón insoportable debajo del yeso. °· El yeso se moja o tiene una zona blanda. °· Siente un feo olor que proviene del interior del yeso. °· Algún objeto se queda atascado bajo el yeso. °· La piel que rodea el yeso enrojece o se vuelve sensible. °· Siente un dolor nuevo o el dolor que sentía empeora luego de la aplicación del yeso. °SOLICITE ATENCIÓN MÉDICA DE INMEDIATO SI:  °· Observa un líquido que sale por el yeso. °· No puede mover el dedo lesionado. °· Los dedos le cambian de color (blancos o azules), siente frío, dolor o por fuera del yeso los dedos están muy inflamados. °· Siente hormigueo o adormecimiento alrededor de la zona de la lesión. °· Siente un dolor o presión intensos debajo del yeso. °·   Presenta dificultad para respirar o le falta el aire. °· Siente dolor en el pecho. °Document Released: 09/14/2005 Document Revised: 07/05/2013 °Exit[employer]® Patient Information ©2014 [employer]. ° °

## 2013-11-06 NOTE — Progress Notes (Signed)
Physical Therapy Treatment Patient Details Name: Tommy Lee MRN: [id number] DOB: 1971-08-08 Today's Date: 11/06/2013 Time: 4098-1191 PT Time Calculation (min): 25 min  PT Assessment / Plan / Recommendation  History of Present Illness Spanish speaking male with two story fall yesterday.   Plastic surgery consulted for multiple facial fractures. 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 Did not wear glasses or have vision problems prior. Notes double vision when looking to sides. pt is s/p ORIF Rt wrist.  Also sustained Lt. elbow fracture.  Lt UE in Bledsoe brace   PT Comments   Patient progressing with ambulation this session and awareness of BUE NWB. Patient required some encouragement to participate in ambulation but agreeable. Per MD note patient is wanting to go home instead of CIR. If patient agrees to CIR and after talking with OT, he would make a great CIR candidate but as of now as patient wants to go home will continue with current POC and HHPT  Follow Up Recommendations  Supervision/Assistance - 24 hour;Home health PT     Does the patient have the potential to tolerate intense rehabilitation     Barriers to Discharge        Equipment Recommendations  None recommended by PT    Recommendations for Other Services    Frequency Min 4X/week   Progress towards PT Goals Progress towards PT goals: Progressing toward goals  Plan Current plan remains appropriate    Precautions / Restrictions Precautions Precautions: Fall Precaution Comments: cannot see out of L eye Required Braces or Orthoses: Sling;Other Brace/Splint Other Brace/Splint: bledsoe brace L elbow and splint R wrist Restrictions RUE Weight Bearing: Non weight bearing LUE Weight Bearing: Non weight bearing   Pertinent Vitals/Pain Complained of pain. RN aware and patient repositioned for comfort     Mobility  Bed Mobility Overal bed mobility: Needs Assistance Bed Mobility: Supine to Sit;Sit to Supine Supine to sit: Min assist General bed mobility comments: Assist to lift trunk due NWB bil. UEs Transfers Overall transfer level: Needs assistance Equipment used: 1 person hand held assist Sit to Stand: Min guard Stand pivot transfers: Min guard General transfer comment: No complaints of dizziness this session. Slight unsteadiness with stand but better awareness of NWB BLE Ambulation/Gait Ambulation/Gait assistance: Min assist Ambulation Distance (Feet): 30 Feet Assistive device: 1 person hand held assist Gait Pattern/deviations: Wide base of support Gait velocity interpretation: Below normal speed for age/gender General Gait Details: Patient with bettter attempt at ambulation this session. Required some encouragement to attempt walking to door and back. Guarded but no LOB    Exercises     PT Diagnosis:    PT Problem List:   PT Treatment Interventions:     PT Goals (current goals can now be found in the care plan section)    Visit Information  Last PT Received On: 11/06/13 Assistance Needed: +1 History of Present Illness: Spanish speaking male with two story fall yesterday.   Plastic surgery consulted for multiple facial fractures. 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 Did not wear glasses or have vision problems prior. Notes double vision when looking to sides. pt is s/p ORIF Rt wrist.  Also sustained Lt. elbow fracture.  Lt UE in Bledsoe brace    Subjective Data      Cognition  Cognition  Arousal/Alertness: Awake/alert Overall Cognitive Status: Impaired/Different from baseline Area of Impairment: Memory;Safety/judgement;Awareness;Problem solving Memory: Decreased short-term memory Safety/Judgement: Decreased awareness of deficits Difficult to assess due to: Non-English  speaking    Balance  Balance Sitting balance-Leahy Scale: Good Standing balance-Leahy Scale: Fair  End of Session PT - End of Session Equipment Utilized During Treatment: Gait belt Activity Tolerance: Patient limited by pain Patient left: in chair;with call bell/phone within reach Nurse Communication: Mobility status   GP     Fredrich BirksRobinette, Abigayl Hor Elizabeth 11/06/2013, 9:54 AM 11/06/2013 Fredrich Birksobinette, Carter Kassel Elizabeth PTA [phone] pager [phone] office

## 2013-11-06 NOTE — Clinical Social Work Note (Signed)
Per chart, patient has refused inpatient rehab placement and chosen to go home with hopes of 24 hour support.  Patient is medically stable and discharging today.  CM to arrange home health as deemed necessary.  Clinical Social Worker will sign off for now as social work intervention is no longer needed. Please consult us again if new need arises.  Macario GoldsJesse Ahsley [place], [place] [phone]

## 2013-11-06 NOTE — Progress Notes (Signed)
Patient ID: Tommy Lee, male   DOB: 1971/07/22, 43 y.o.   MRN: [id number]   LOS: 6 days   Subjective: Language barrier. Pain medicine working. Doesn't want to go to CIR, wants to go home.   Objective: Vital signs in last 24 hours: Temp:  [98.6 F (37 C)-100.2 F (37.9 C)] 98.7 F (37.1 C) (02/09 0609) Pulse Rate:  [90-99] 90 (02/09 0609) Resp:  [18-19] 18 (02/09 0609) BP: (121-129)/(53-68) 128/53 mmHg (02/09 0609) SpO2:  [97 %-98 %] 97 % (02/09 0609) Last BM Date: 11/02/13   Physical Exam General appearance: alert and no distress Resp: clear to auscultation bilaterally Cardio: regular rate and rhythm GI: normal findings: bowel sounds normal and soft, non-tender Extremities: NVI   Assessment/Plan: Fall  Multiple facial FXs - per Dr. Leta Baptisthimmappa  Anterior and posterior table frontal sinus FXs with pneumocephalus - No signs of CSF leak  OU optic nerve injury - F/U with Dr. Vonna KotykBevis after D/C  L intracranial ICA dissection - ASA per Dr. Conchita ParisNundkumar  Open R wrist and L elbow FXs s/p ORIF -- NWB RUE, limited LUE  L renal contusion - urine appears clear  ABL anemia - Stable FEN - No issues VTE -- SCD's  Dispo - Will return with interpreter to discuss d/c home vs other options.    Freeman CaldronMichael J. Florentine Diekman, PA-C Pager: [phone] General Trauma PA Pager: [phone]   11/06/2013

## 2013-11-06 NOTE — Progress Notes (Signed)
Occupational Therapy Treatment Patient Details Name: Tommy Lee MRN: [id number] DOB: Jan 27, 1971 Today's Date: 11/06/2013 Time: 1245-1400 OT Time Calculation (min): 75 min  OT Assessment / Plan / Recommendation  History of present illness Spanish speaking male with two story fall yesterday.   Plastic surgery consulted for multiple facial fractures. 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 Did not wear glasses or have vision problems prior. Notes double vision when looking to sides. pt is s/p ORIF Rt wrist.  Also sustained Lt. elbow fracture.  Lt UE in Bledsoe brace   OT comments  Pt more participatory today, but requires max encouragement.  Min A with BADLs.  Follow Up Recommendations  Home health OT;Supervision/Assistance - 24 hour    Barriers to Discharge       Equipment Recommendations  None recommended by OT    Recommendations for Other Services    Frequency Min 3X/week   Progress towards OT Goals Progress towards OT goals: Progressing toward goals  Plan Discharge plan needs to be updated    Precautions / Restrictions Precautions Precautions: Fall Precaution Comments: cannot see out of L eye Required Braces or Orthoses: Sling;Other Brace/Splint Other Brace/Splint: bledsoe brace L elbow and splint R wrist Restrictions Weight Bearing Restrictions: Yes RUE Weight Bearing: Non weight bearing LUE Weight Bearing: Non weight bearing   Pertinent Vitals/Pain     ADL  Eating/Feeding: Set up Where Assessed - Eating/Feeding: Edge of bed Grooming: Wash/dry face;Set up;Brushing hair Where Assessed - Grooming: Unsupported sitting Lower Body Dressing: Minimal assistance Where Assessed - Lower Body Dressing: Supported sit to stand Toilet Transfer: Supervision/safety StatisticianToilet Transfer Method: Sit to Barist[place]and Toilet Transfer Equipment: Comfort height toilet Toileting - Clothing  Manipulation and Hygiene: Minimal assistance Where Assessed - Engineer, miningToileting Clothing Manipulation and Hygiene: Sit to stand from 3-in-1 or toilet Transfers/Ambulation Related to ADLs: supervision ADL Comments: Pt continues to require max encouragement for participation, but will attempt ADL activity with encouragement.  Interpreter present.  Pt indicates pain as minimal. He is adamantly refusing rehab - wants to go home.  He is able to state his precautions. Pt instructed to use a XL glove over Rt UE for toileting tasks.  Pt also provided with LH sponge and reacher, and instructed in use.  Emotional support provided.  Pt encouraged to continue to perofrm BIl. UE exercises and was able to demonstrate those    OT Diagnosis:    OT Problem List:   OT Treatment Interventions:     OT Goals(current goals can now be found in the care plan section) ADL Goals Pt Will Perform Eating: with supervision;with set-up;sitting Pt Will Perform Grooming: with set-up;with supervision;sitting;with adaptive equipment Pt Will Perform Upper Body Bathing: with min assist;with caregiver independent in assisting;sitting Pt Will Perform Lower Body Bathing: with min assist;with caregiver independent in assisting Pt Will Perform Upper Body Dressing: with min assist;with caregiver independent in assisting;sitting Pt Will Transfer to Toilet: with modified independence;ambulating Pt Will Perform Toileting - Clothing Manipulation and hygiene: with modified independence;with adaptive equipment;sit to/from stand  Visit Information  Last OT Received On: 11/06/13 Assistance Needed: +1 History of Present Illness: Spanish speaking male with two story fall yesterday.   Plastic surgery consulted for multiple facial fractures. 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 Did not wear glasses or have vision problems  prior. Notes double  vision when looking to sides. pt is s/p ORIF Rt wrist.  Also sustained Lt. elbow fracture.  Lt UE in Bledsoe brace    Subjective Data      Prior Functioning       Cognition  Cognition Arousal/Alertness: Awake/alert Behavior During Therapy: WFL for tasks assessed/performed Overall Cognitive Status: Impaired/Different from baseline Awareness: Intellectual Problem Solving: Decreased initiation    Mobility  Bed Mobility Overal bed mobility: Modified Independent Bed Mobility: Supine to Sit Supine to sit: Supervision Sit to supine: Supervision Transfers Overall transfer level: Needs assistance Transfers: Sit to/from Stand Sit to Stand: Supervision Stand pivot transfers: Supervision    Exercises      Balance    End of Session OT - End of Session Activity Tolerance: Patient tolerated treatment well (with max encouragement) Patient left: in bed;with call bell/phone within reach Nurse Communication: Mobility status  GO     Jeani Hawking M 11/06/2013, 6:32 PM

## 2013-11-06 NOTE — Progress Notes (Signed)
D/C orders received. Pt and friend educated on D/C instructions. Spanish interpreter used via phone. Pt verbalized understanding. Pt downstairs by staff via wheelchair.

## 2013-11-06 NOTE — Progress Notes (Signed)
Rehab Admissions Coordinator Note:  Patient was screened by Trish MageLogue, Morene Cecilio M for appropriateness for an Inpatient Acute Rehab Consult.  Noted PT recommending HH and OT recommending inpatient rehab consult. Patient may benefit from inpatient rehab consult.  Lelon FrohlichLogue, Darlina Mccaughey M 11/06/2013, 9:27 AM  I can be reached at [phone].

## 2013-11-06 NOTE — Progress Notes (Signed)
No issues overnight. Pt denies any fluid leakage from nose/ears. No HA.  EXAM:  BP 145/88  Pulse 101  Temp(Src) 98.7 F (37.1 C) (Oral)  Resp 18  Ht 5\' 10"  (1.778 m)  Wt 79.9 kg (176 lb 2.4 oz)  BMI 25.27 kg/m2  SpO2 100%  Awake, alert, oriented  Speech fluent, appropriate  Good strength throughout  IMPRESSION:  43 y.o. 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

## 2013-11-06 NOTE — Progress Notes (Signed)
Interpreter Wyvonnia DuskyGraciela Namihira for MD and Toniann FailWendy

## 2013-11-06 NOTE — Discharge Summary (Signed)
Physician Discharge Summary  Patient ID: Guillermina Citynrique Zenker MRN: [id number] DOB/AGE: [birth year] 41 y.o.  Admit date: 10/31/2013 Discharge date: 11/06/2013  Discharge Diagnoses Patient Active Problem List   Diagnosis Date Noted  . Fall 11/01/2013  . Traumatic pneumocephalus 11/01/2013  . Left elbow fracture 11/01/2013  . Open fracture of right wrist 11/01/2013  . Concussion with loss of consciousness 11/01/2013  . Acute respiratory failure 11/01/2013  . Carotid artery injury 11/01/2013  . Renal contusion 11/01/2013  . Acute blood loss anemia 11/01/2013  . Multiple facial fractures 10/31/2013    Consultants Dr. Margarita Ranaimothy Murphy for orthopedic surgery  Dr. Sherrilyn RistBrenda Thimmappa for plastic surgery  Dr. Lisbeth RenshawNeelesh Nundkumar for neurosurgery  Dr. Mia Creekimothy Bevis for ophthalmology   Procedures ORIF of right distal radius fracture by Dr. 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 He fell from the second story level and landed on his face. He had a brief loss of consciousness. [facility] and made a level 2 trauma after arrival by the emergency department physician. His workup demonstrated open right distal radius fracture, multiple facial fractures including both tables of the frontal sinus with associated pneumocephalus, and left upper quadrant retroperitoneal stranding around the tail the pancreas. Urine was initially clear but when he urinated a second time, there was hematuria and on review of his abdominal CT it appears he has a left renal contusion as well. Orthopedic surgery, plastic surgery, and neurosurgery were consulted and he was admitted by the trauma service.   Hospital Course: He was taken to the OR for fixation of his wrist. He was then scheduled to have a follow-up head CT but it was accidentally ordered and performed as a CT angiogram. This showed a carotid dissection as well but no delayed head injury and he was started on an  aspirin. Facial surgery recommended non-operative treatment for the facial fractures unless he developed a CSF leak. Ophthalmology was consulted the next day but did not feel any intervention would be helpful and that his vision was likely permanently lost. The traumatic brain injury therapy team was consulted and he did seem to have a CSF leak when he got up. He was placed on bedrest for 48 hours and this seemed to correct the problem. His pain was controlled on oral medication and he was tolerating a diet. Therapies were recommending home with 24-hour supervision or inpatient rehabilitation but the patient decided that he would go home with intermittent supervision and was clearly competent to make that decision. The interpreter was worried about depression and suicidal ideation; this was addressed directly and the patient denied any. He was given information on who to call if he did feel like he needed help. He was discharged home in improved condition.      Medication List         aspirin 325 MG EC tablet  Take 1 tablet (325 mg total) by mouth daily.     oxyCODONE-acetaminophen 10-325 MG per tablet  Commonly known as:  PERCOCET  Take 0.5-2 tablets by mouth every 4 (four) hours as needed for pain.             Follow-up Information   Schedule an appointment as soon as possible for a visit with Sheral ApleyMURPHY, TIMOTHY, D, MD.   Specialty:  Orthopedic Surgery   Contact information:   [address] [place] [address] [phone]       Schedule an  appointment as soon as possible for a visit with Jackelyn Hoehn, MD.   Specialty:  Neurosurgery   Contact information:   [address] [place] [address] [place] [address] [phone]       Schedule an appointment as soon as possible for a visit with Leone Haven., MD.   Specialty:  Ophthalmology   Contact information:   [address] [place] [address] [place]  [address] [phone]       Call [facility]. (As needed)    Contact information:   [address] [place] [address] [phone]       Discharge planning took greater than 30 minutes.    Signed: Freeman Caldron, PA-C Pager: [phone] General Trauma PA Pager: [phone]  11/06/2013, 1:08 PM

## 2013-11-30 ENCOUNTER — Telehealth (HOSPITAL_COMMUNITY): Payer: Self-pay | Admitting: Lab

## 2013-11-30 NOTE — Telephone Encounter (Signed)
Spoke with case manager who wanted a follow-up appointment in the trauma clinic, mainly because she was having some difficulty getting appointments with the specialists who saw him. I recommended she keep trying as my clinic evaluation would result in the suggestion that he follow up with the specialists.

## 2014-07-09 ENCOUNTER — Other Ambulatory Visit: Payer: Self-pay | Admitting: Orthopedic Surgery

## 2014-07-12 NOTE — H&P (Signed)
Tommy Lee is an 43 y.o. male.   CC / Reason for Visit: Right wrist injury HPI: This patient returns for reevaluation, participating in work conditioning and having plateaued.  He remains at up to 35 pounds constantly but not quite at the 40 pounds of frequent lifting that is required.  With activity, particular heavy activities and simulated work activity such as standing he complains of pain on the dorsal ulnar aspect of the wrist.  With regard to the left elbow, this appears to be minimally symptomatic.  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  He sustained a severely comminuted distal radial articular fracture with diaphyseal extension which underwent ORIF by Dr. Eulah PontMurphy.  The patient has been undergoing hand therapy at hand and physical therapy specialists on [address], where he reports he has been working with [employer].  He reports today that his goals are to bring his fingers into a better fist and to achieve better range of motion of the wrist.  I asked for him to describe to me the home exercise program in which he is engaged, he is not able to explain it very well with any degree of detail, particularly as it relates to range of motion exercises.  No past medical history on file.  Past Surgical History  Procedure Laterality Date  . Orif wrist fracture Right 10/31/2013    Procedure: OPEN REDUCTION INTERNAL FIXATION (ORIF) WRIST FRACTURE;  Surgeon: Sheral Apleyimothy D Murphy, MD;  Location: [facility] OR;  Service: Orthopedics;  Laterality: Right;    No family history on file. Social History:  reports that he has never smoked. He does not have any smokeless tobacco history on file. He reports that he does not drink alcohol. His drug history is not on file.  Allergies: No Known Allergies  No prescriptions prior to admission    No results found for this or any previous visit (from the past 48 hour(s)). No  results found.  Review of Systems  All other systems reviewed and are negative.   There were no vitals taken for this visit. Physical Exam  Constitutional:  WD, WN, NAD HEENT:  NCAT, EOMI Neuro/Psych:  Alert & oriented to person, place, and time; appropriate mood & affect Lymphatic: No generalized UE edema or lymphadenopathy Extremities / MSK:  Both UE are normal with respect to appearance, ranges of motion, joint stability, muscle strength/tone, sensation, & perfusion except as otherwise noted:  The right wrist has well-healed scar.  Flexion 45, extension 30, pronation 70, supination 70.  Composite digital motion is reasonably full.  Grip strength in position 2: Right 40/left 63.  He has tenderness over the distal aspect of the ulna, which is prominent.  The DRUJ appears to be stable to translational stress throughout its range.  Labs / X-rays: Complete x-rays of the right elbow ordered and obtained today reveal a healed radial head fracture with minimal step-off 4 views of the right wrist ordered and obtained today reveals healed distal radius fracture, with ulnar positivity measuring what appears to be 5-6 mm.  There appears to be reasonably good radial carpal joint space preserved.  Assessment:  Healed comminuted right distal radius fracture, with some ulnar positivity and DRUJ widening Healed left radial head fracture  Plan:  I discussed these findings with him with an official interpreter and his nurse case manager present.  I presented the options of continuing to maximize his function nonoperatively versus  considering surgery to improve his wrist motion and hopefully decrease the pain, thereby increasing strength and function.  I recommended an ulnar shortening osteotomy as the best surgical option for him at present.  After some deliberation, he indicated he would like to proceed with the ulnar shortening osteotomy and this will be scheduled once workers compensation authorization  is received.    The details of the operative procedure were discussed with the patient.  Questions were invited and answered.  In addition to the goal of the procedure, the risks of the procedure to include but not limited to bleeding; infection; damage to the nerves or blood vessels that could result in bleeding, numbness, weakness, chronic pain, and the need for additional procedures; stiffness; the need for revision surgery; and anesthetic risks, the worst of which is death.  No specific outcome was guaranteed or implied.  Informed consent was obtained.    Doretta Remmert A. 07/12/2014, 2:13 PM

## 2014-07-13 ENCOUNTER — Encounter (HOSPITAL_BASED_OUTPATIENT_CLINIC_OR_DEPARTMENT_OTHER): Payer: Self-pay | Admitting: *Deleted

## 2014-07-13 NOTE — Progress Notes (Signed)
Spanish interpreter [phone] information updated-npo p mn-arrive with friend 6am

## 2014-07-16 ENCOUNTER — Encounter (HOSPITAL_BASED_OUTPATIENT_CLINIC_OR_DEPARTMENT_OTHER)
Admission: RE | Disposition: A | Discharge status: Home / Self Care | Payer: Self-pay | Source: Ambulatory Visit | Attending: Orthopedic Surgery

## 2014-07-16 ENCOUNTER — Ambulatory Visit (HOSPITAL_COMMUNITY): Payer: Worker's Compensation

## 2014-07-16 ENCOUNTER — Encounter (HOSPITAL_BASED_OUTPATIENT_CLINIC_OR_DEPARTMENT_OTHER): Payer: Self-pay | Admitting: Certified Registered"

## 2014-07-16 ENCOUNTER — Encounter (HOSPITAL_BASED_OUTPATIENT_CLINIC_OR_DEPARTMENT_OTHER): Payer: Worker's Compensation | Admitting: Certified Registered"

## 2014-07-16 ENCOUNTER — Ambulatory Visit (HOSPITAL_BASED_OUTPATIENT_CLINIC_OR_DEPARTMENT_OTHER)
Admission: RE | Admit: 2014-07-16 | Discharge: 2014-07-16 | Disposition: A | Discharge status: Home / Self Care | Payer: Worker's Compensation | Source: Ambulatory Visit | Attending: Orthopedic Surgery | Admitting: Orthopedic Surgery

## 2014-07-16 ENCOUNTER — Ambulatory Visit (HOSPITAL_BASED_OUTPATIENT_CLINIC_OR_DEPARTMENT_OTHER): Payer: Worker's Compensation | Admitting: Certified Registered"

## 2014-07-16 DIAGNOSIS — W139XXS Fall from, out of or through building, not otherwise specified, sequela: Secondary | ICD-10-CM | POA: Diagnosis not present

## 2014-07-16 DIAGNOSIS — M24831 Other specific joint derangements of right wrist, not elsewhere classified: Secondary | ICD-10-CM | POA: Insufficient documentation

## 2014-07-16 DIAGNOSIS — M25531 Pain in right wrist: Secondary | ICD-10-CM

## 2014-07-16 DIAGNOSIS — I739 Peripheral vascular disease, unspecified: Secondary | ICD-10-CM | POA: Diagnosis not present

## 2014-07-16 DIAGNOSIS — S52571S Other intraarticular fracture of lower end of right radius, sequela: Secondary | ICD-10-CM | POA: Diagnosis not present

## 2014-07-16 HISTORY — PX: ULNA OSTEOTOMY: SHX1077

## 2014-07-16 LAB — POCT HEMOGLOBIN-HEMACUE: Hemoglobin: 16.8 g/dL (ref 13.0–17.0)

## 2014-07-16 SURGERY — SHORTENING, ULNA
Anesthesia: Regional | Site: Wrist | Laterality: Right

## 2014-07-16 MED ORDER — MIDAZOLAM HCL 2 MG/2ML IJ SOLN
INTRAMUSCULAR | Status: AC
  Filled 2014-07-16: qty 2

## 2014-07-16 MED ORDER — SUCCINYLCHOLINE CHLORIDE 20 MG/ML IJ SOLN
INTRAMUSCULAR | Status: AC
  Filled 2014-07-16: qty 1

## 2014-07-16 MED ORDER — LACTATED RINGERS IV SOLN
INTRAVENOUS | Status: DC
  Administered 2014-07-16: 07:00:00 via INTRAVENOUS

## 2014-07-16 MED ORDER — ONDANSETRON HCL 4 MG/2ML IJ SOLN
INTRAMUSCULAR | Status: DC | PRN
  Administered 2014-07-16: 4 mg via INTRAVENOUS

## 2014-07-16 MED ORDER — OXYCODONE HCL 5 MG PO TABS: 5.0000 mg | ORAL_TABLET | Freq: Once | ORAL | Status: DC | PRN

## 2014-07-16 MED ORDER — DEXAMETHASONE SODIUM PHOSPHATE 10 MG/ML IJ SOLN
INTRAMUSCULAR | Status: DC | PRN
  Administered 2014-07-16: 10 mg via INTRAVENOUS

## 2014-07-16 MED ORDER — FENTANYL CITRATE 0.05 MG/ML IJ SOLN
INTRAMUSCULAR | Status: AC
  Filled 2014-07-16: qty 2

## 2014-07-16 MED ORDER — PROPOFOL 10 MG/ML IV EMUL
INTRAVENOUS | Status: AC
  Filled 2014-07-16: qty 50

## 2014-07-16 MED ORDER — ROPIVACAINE HCL 5 MG/ML IJ SOLN
INTRAMUSCULAR | Status: DC | PRN
  Administered 2014-07-16: 40 mL via PERINEURAL

## 2014-07-16 MED ORDER — 0.9 % SODIUM CHLORIDE (POUR BTL) OPTIME
TOPICAL | Status: DC | PRN
  Administered 2014-07-16: 400 mL

## 2014-07-16 MED ORDER — CEFAZOLIN SODIUM-DEXTROSE 2-3 GM-% IV SOLR
INTRAVENOUS | Status: DC | PRN
  Administered 2014-07-16: 2 g via INTRAVENOUS

## 2014-07-16 MED ORDER — OXYCODONE-ACETAMINOPHEN 5-325 MG PO TABS: 1.0000 | ORAL_TABLET | ORAL | Status: AC | PRN

## 2014-07-16 MED ORDER — CEFAZOLIN SODIUM-DEXTROSE 2-3 GM-% IV SOLR
INTRAVENOUS | Status: AC
  Filled 2014-07-16: qty 50

## 2014-07-16 MED ORDER — BUPIVACAINE-EPINEPHRINE (PF) 0.5% -1:200000 IJ SOLN
INTRAMUSCULAR | Status: AC
Start: 2014-07-16 — End: 2014-07-16
  Filled 2014-07-16: qty 60

## 2014-07-16 MED ORDER — PROPOFOL 10 MG/ML IV BOLUS
INTRAVENOUS | Status: DC | PRN
  Administered 2014-07-16: 200 mg via INTRAVENOUS

## 2014-07-16 MED ORDER — CEFAZOLIN SODIUM-DEXTROSE 2-3 GM-% IV SOLR: 2.0000 g | INTRAVENOUS | Status: DC

## 2014-07-16 MED ORDER — HYDROMORPHONE HCL 1 MG/ML IJ SOLN
0.2500 mg | INTRAMUSCULAR | Status: DC | PRN
  Administered 2014-07-16 (×3): 0.5 mg via INTRAVENOUS

## 2014-07-16 MED ORDER — FENTANYL CITRATE 0.05 MG/ML IJ SOLN
50.0000 ug | INTRAMUSCULAR | Status: DC | PRN
  Administered 2014-07-16: 100 ug via INTRAVENOUS

## 2014-07-16 MED ORDER — HYDROMORPHONE HCL 1 MG/ML IJ SOLN
INTRAMUSCULAR | Status: AC
  Filled 2014-07-16: qty 1

## 2014-07-16 MED ORDER — MIDAZOLAM HCL 2 MG/2ML IJ SOLN
1.0000 mg | INTRAMUSCULAR | Status: DC | PRN
  Administered 2014-07-16: 2 mg via INTRAVENOUS

## 2014-07-16 MED ORDER — LACTATED RINGERS IV SOLN
INTRAVENOUS | Status: DC | PRN
  Administered 2014-07-16 (×2): via INTRAVENOUS

## 2014-07-16 MED ORDER — MIDAZOLAM HCL 5 MG/5ML IJ SOLN
INTRAMUSCULAR | Status: DC | PRN
  Administered 2014-07-16: 2 mg via INTRAVENOUS

## 2014-07-16 MED ORDER — LACTATED RINGERS IV SOLN
INTRAVENOUS | Status: DC
Start: 2014-07-16 — End: 2014-07-16
  Administered 2014-07-16: 11:00:00 via INTRAVENOUS

## 2014-07-16 MED ORDER — LIDOCAINE HCL (CARDIAC) 20 MG/ML IV SOLN
INTRAVENOUS | Status: DC | PRN
  Administered 2014-07-16: 20 mg via INTRAVENOUS

## 2014-07-16 MED ORDER — OXYCODONE HCL 5 MG/5ML PO SOLN: 5.0000 mg | Freq: Once | ORAL | Status: DC | PRN

## 2014-07-16 SURGICAL SUPPLY — 77 items
BANDAGE COBAN STERILE 2 (GAUZE/BANDAGES/DRESSINGS) IMPLANT
BENZOIN TINCTURE PRP APPL 2/3 (GAUZE/BANDAGES/DRESSINGS) IMPLANT
BIT DRILL 2.8X5 QR DISP (BIT) ×3 IMPLANT
BIT DRILL QUICK RELEASE 3.5MM (BIT) ×1 IMPLANT
BLADE AVERAGE 25MMX9MM (BLADE)
BLADE AVERAGE 25X9 (BLADE) IMPLANT
BLADE MINI RND TIP GREEN BEAV (BLADE) IMPLANT
BLADE SAW OSTEOTOMY (BLADE) ×3 IMPLANT
BLADE SURG 15 STRL LF DISP TIS (BLADE) ×1 IMPLANT
BLADE SURG 15 STRL SS (BLADE) ×2
BNDG COHESIVE 4X5 TAN STRL (GAUZE/BANDAGES/DRESSINGS) ×3 IMPLANT
BNDG ESMARK 4X9 LF (GAUZE/BANDAGES/DRESSINGS) ×3 IMPLANT
BNDG GAUZE ELAST 4 BULKY (GAUZE/BANDAGES/DRESSINGS) ×6 IMPLANT
BRUSH SCRUB EZ PLAIN DRY (MISCELLANEOUS) IMPLANT
BUR EGG 3PK/BX (BURR) IMPLANT
CANISTER LINER 1300 C W/ELBOW (MISCELLANEOUS) IMPLANT
CANISTER SUCT 1200ML W/VALVE (MISCELLANEOUS) ×3 IMPLANT
CHLORAPREP W/TINT 26ML (MISCELLANEOUS) ×3 IMPLANT
CLOSURE WOUND 1/2 X4 (GAUZE/BANDAGES/DRESSINGS)
CORDS BIPOLAR (ELECTRODE) ×3 IMPLANT
COVER BACK TABLE 60X90IN (DRAPES) ×3 IMPLANT
COVER MAYO STAND STRL (DRAPES) ×3 IMPLANT
CUFF TOURNIQUET SINGLE 18IN (TOURNIQUET CUFF) ×3 IMPLANT
CUFF TOURNIQUET SINGLE 24IN (TOURNIQUET CUFF) IMPLANT
DRAPE C-ARM 42X72 X-RAY (DRAPES) ×3 IMPLANT
DRAPE EXTREMITY TIBURON (DRAPES) ×3 IMPLANT
DRAPE SURG 17X23 STRL (DRAPES) ×3 IMPLANT
DRILL QUICK RELEASE 3.5MM (BIT) ×3
DRSG ADAPTIC 3X8 NADH LF (GAUZE/BANDAGES/DRESSINGS) ×3 IMPLANT
DRSG EMULSION OIL 3X3 NADH (GAUZE/BANDAGES/DRESSINGS) IMPLANT
ELECT REM PT RETURN 9FT ADLT (ELECTROSURGICAL) ×3
ELECTRODE REM PT RTRN 9FT ADLT (ELECTROSURGICAL) ×1 IMPLANT
GAUZE SPONGE 4X4 12PLY STRL (GAUZE/BANDAGES/DRESSINGS) ×3 IMPLANT
GLOVE BIO SURGEON STRL SZ 6.5 (GLOVE) ×2 IMPLANT
GLOVE BIO SURGEON STRL SZ7.5 (GLOVE) ×3 IMPLANT
GLOVE BIO SURGEONS STRL SZ 6.5 (GLOVE) ×1
GLOVE BIOGEL PI IND STRL 7.0 (GLOVE) ×2 IMPLANT
GLOVE BIOGEL PI IND STRL 8 (GLOVE) ×1 IMPLANT
GLOVE BIOGEL PI INDICATOR 7.0 (GLOVE) ×4
GLOVE BIOGEL PI INDICATOR 8 (GLOVE) ×2
GLOVE ECLIPSE 6.5 STRL STRAW (GLOVE) ×6 IMPLANT
GOWN STRL REUS W/ TWL LRG LVL3 (GOWN DISPOSABLE) ×2 IMPLANT
GOWN STRL REUS W/TWL LRG LVL3 (GOWN DISPOSABLE) ×4
GOWN STRL REUS W/TWL XL LVL3 (GOWN DISPOSABLE) ×3 IMPLANT
GUIDEWIRE ORTHO 0.054X6 (WIRE) ×6 IMPLANT
NEEDLE HYPO 25X1 1.5 SAFETY (NEEDLE) IMPLANT
NS IRRIG 1000ML POUR BTL (IV SOLUTION) ×3 IMPLANT
PACK BASIN DAY SURGERY FS (CUSTOM PROCEDURE TRAY) ×3 IMPLANT
PADDING CAST ABS 4INX4YD NS (CAST SUPPLIES)
PADDING CAST ABS COTTON 4X4 ST (CAST SUPPLIES) IMPLANT
PENCIL BUTTON HOLSTER BLD 10FT (ELECTRODE) ×3 IMPLANT
PLATE ULNAR SHORTENING (Plate) ×3 IMPLANT
RUBBERBAND STERILE (MISCELLANEOUS) IMPLANT
SCREW HEX LOCK 3.5X20MM (Screw) ×3 IMPLANT
SCREW HEXALOBE LOCKING 3.5X14M (Screw) ×3 IMPLANT
SCREW HEXALOBE NON-LOCK 3.5X14 (Screw) ×3 IMPLANT
SCREW HEXALOBE NON-LOCK 3.5X16 (Screw) ×3 IMPLANT
SCREW NON LOCKING HEX 3.5X18MM (Screw) ×3 IMPLANT
SCREW NONLOCK HEX 3.5X12 (Screw) ×3 IMPLANT
SLEEVE SCD COMPRESS KNEE MED (MISCELLANEOUS) ×3 IMPLANT
SPLINT PLASTER CAST XFAST 4X15 (CAST SUPPLIES) IMPLANT
SPLINT PLASTER XTRA FAST SET 4 (CAST SUPPLIES)
STOCKINETTE 6  STRL (DRAPES) ×2
STOCKINETTE 6 STRL (DRAPES) ×1 IMPLANT
STRIP CLOSURE SKIN 1/2X4 (GAUZE/BANDAGES/DRESSINGS) IMPLANT
SUCTION FRAZIER TIP 10 FR DISP (SUCTIONS) IMPLANT
SUT VIC AB 2-0 CT3 27 (SUTURE) ×3 IMPLANT
SUT VICRYL 4-0 PS2 18IN ABS (SUTURE) IMPLANT
SUT VICRYL RAPIDE 4-0 (SUTURE) IMPLANT
SUT VICRYL RAPIDE 4/0 PS 2 (SUTURE) ×3 IMPLANT
SYR BULB 3OZ (MISCELLANEOUS) ×3 IMPLANT
SYRINGE 10CC LL (SYRINGE) IMPLANT
TOWEL OR 17X24 6PK STRL BLUE (TOWEL DISPOSABLE) ×3 IMPLANT
TOWEL OR NON WOVEN STRL DISP B (DISPOSABLE) ×3 IMPLANT
TUBE CONNECTING 20'X1/4 (TUBING)
TUBE CONNECTING 20X1/4 (TUBING) IMPLANT
UNDERPAD 30X30 INCONTINENT (UNDERPADS AND DIAPERS) ×3 IMPLANT

## 2014-07-16 NOTE — Discharge Instructions (Addendum)
Discharge Instructions   You have a dressing with a plaster splint incorporated in it. Move your fingers as much as possible, making a full fist and fully opening the fist. Elevate your hand to reduce pain & swelling of the digits.  Ice over the operative site may be helpful to reduce pain & swelling.  DO NOT USE HEAT. Pain medicine has been prescribed for you.  Use your medicine as needed over the first 48 hours, and then you can begin to taper your use.  You may use Tylenol in place of your prescribed pain medication, but not IN ADDITION to it. Leave the dressing in place until you return to our office.  You may shower, but keep the bandage clean & dry.  You may drive a car when you are off of prescription pain medications and can safely control your vehicle with both hands. Our office will call you to arrange follow-up   Please call [phone] during normal business hours or [phone] after hours for any problems. Including the following:  - excessive redness of the incisions - drainage for more than 4 days - fever of more than 101.5 F  *Please note that pain medications will not be refilled after hours or on weekends.  WORK STATUS:  NO WORK UNTIL AT LEAST FIRST POSTOP VISIT IN APPROX. 2 WEEKS    Regional Anesthesia Blocks  1. Numbness or the inability to move the "blocked" extremity may last from 3-48 hours after placement. The length of time depends on the medication injected and your individual response to the medication. If the numbness is not going away after 48 hours, call your surgeon.  2. The extremity that is blocked will need to be protected until the numbness is gone and the  Strength has returned. Because you cannot feel it, you will need to take extra care to avoid injury. Because it may be weak, you may have difficulty moving it or using it. You may not know what position it is in without looking at it while the block is in effect.  3. For blocks in the legs and  feet, returning to weight bearing and walking needs to be done carefully. You will need to wait until the numbness is entirely gone and the strength has returned. You should be able to move your leg and foot normally before you try and bear weight or walk. You will need someone to be with you when you first try to ensure you do not fall and possibly risk injury.  4. Bruising and tenderness at the needle site are common side effects and will resolve in a few days.  5. Persistent numbness or new problems with movement should be communicated to the surgeon or the [facility] [phone])/ [facility] [phone]).     Post Anesthesia Home Care Instructions  Activity: Get plenty of rest for the remainder of the day. A responsible adult should stay with you for 24 hours following the procedure.  For the next 24 hours, DO NOT: -Drive a car -Advertising copywriterperate machinery -Drink alcoholic beverages -Take any medication unless instructed by your physician -Make any legal decisions or sign important papers.  Meals: Start with liquid foods such as gelatin or soup. Progress to regular foods as tolerated. Avoid greasy, spicy, heavy foods. If nausea and/or vomiting occur, drink only clear liquids until the nausea and/or vomiting subsides. Call your physician if vomiting continues.  Special Instructions/Symptoms: Your throat may feel dry or sore from the  anesthesia or the breathing tube placed in your throat during surgery. If this causes discomfort, gargle with warm salt water. The discomfort should disappear within 24 hours.

## 2014-07-16 NOTE — Anesthesia Preprocedure Evaluation (Addendum)
Anesthesia Evaluation  Patient identified by MRN, date of birth, ID band Patient awake    Reviewed: Allergy & Precautions, H&P , NPO status , Patient's Chart, lab work & pertinent test results  Airway Mallampati: II TM Distance: >3 FB Neck ROM: Full    Dental no notable dental hx. (+) Teeth Intact, Dental Advisory Given   Pulmonary neg pulmonary ROS,  breath sounds clear to auscultation  Pulmonary exam normal       Cardiovascular + Peripheral Vascular Disease negative cardio ROS  Rhythm:Regular Rate:Normal     Neuro/Psych negative neurological ROS  negative psych ROS   GI/Hepatic negative GI ROS, Neg liver ROS,   Endo/Other  negative endocrine ROS  Renal/GU negative Renal ROS  negative genitourinary   Musculoskeletal   Abdominal   Peds  Hematology negative hematology ROS (+)   Anesthesia Other Findings   Reproductive/Obstetrics negative OB ROS                          Anesthesia Physical Anesthesia Plan  ASA: I  Anesthesia Plan: General and Regional   Post-op Pain Management:    Induction: Intravenous  Airway Management Planned: LMA  Additional Equipment:   Intra-op Plan:   Post-operative Plan: Extubation in OR  Informed Consent: I have reviewed the patients History and Physical, chart, labs and discussed the procedure including the risks, benefits and alternatives for the proposed anesthesia with the patient or authorized representative who has indicated his/her understanding and acceptance.   Dental advisory given  Plan Discussed with: CRNA  Anesthesia Plan Comments:         Anesthesia Quick Evaluation

## 2014-07-16 NOTE — Anesthesia Procedure Notes (Addendum)
Anesthesia Regional Block:  Supraclavicular block  Pre-Anesthetic Checklist: ,, timeout performed, Correct Patient, Correct Site, Correct Laterality, Correct Procedure, Correct Position, site marked, Risks and benefits discussed, pre-op evaluation, post-op pain management  Laterality: Right  Prep: Maximum Sterile Barrier Precautions used and chloraprep       Needles:  Injection technique: Single-shot  Needle Type: Echogenic Stimulator Needle     Needle Length: 5cm 5 cm Needle Gauge: 22 and 22 G    Additional Needles:  Procedures: ultrasound guided (picture in chart) Supraclavicular block Narrative:  Start time: 07/16/2014 7:10 AM End time: 07/16/2014 7:20 AM Injection made incrementally with aspirations every 5 mL. Anesthesiologist: Fitzgerald,MD  Additional Notes: 2% Lidocaine skin wheel. Intercostobrachial block with 10cc of 0.5% Ropivicaine plain.   Procedure Name: LMA Insertion Date/Time: 07/16/2014 7:47 AM Performed by: Joe Gee Pre-anesthesia Checklist: Patient identified, Emergency Drugs available, Suction available and Patient being monitored Patient Re-evaluated:Patient Re-evaluated prior to inductionOxygen Delivery Method: Circle System Utilized Preoxygenation: Pre-oxygenation with 100% oxygen Intubation Type: IV induction Ventilation: Mask ventilation without difficulty LMA: LMA inserted LMA Size: 4.0 Number of attempts: 1 Airway Equipment and Method: bite block Placement Confirmation: positive ETCO2 Tube secured with: Tape Dental Injury: Teeth and Oropharynx as per pre-operative assessment

## 2014-07-16 NOTE — Interval H&P Note (Signed)
History and Physical Interval Note:  07/16/2014 10:23 AM  Tommy Lee  has presented today for surgery, with the diagnosis of RIGHT WRIST PAIN  The various methods of treatment have been discussed with the patient and family. After consideration of risks, benefits and other options for treatment, the patient has consented to  Procedure(s): RIGHT ULNAR SHORTENING OSTEOTOMY (Right) as a surgical intervention .  The patient's history has been reviewed, patient examined, no change in status, stable for surgery.  I have reviewed the patient's chart and labs.  Questions were answered to the patient's satisfaction.     Clint Biello A.

## 2014-07-16 NOTE — Interval H&P Note (Signed)
History and Physical Interval Note:  07/16/2014 7:39 AM  Tommy Lee  has presented today for surgery, with the diagnosis of left wrist pain  The various methods of treatment have been discussed with the patient and family. After consideration of risks, benefits and other options for treatment, the patient has consented to  Procedure(s): LEFT ULNAR SHORTENING OSTEOTOMY (Left) as a surgical intervention .  The patient's history has been reviewed, patient examined, no change in status, stable for surgery.  I have reviewed the patient's chart and labs.  Questions were answered to the patient's satisfaction.     [name] A.

## 2014-07-16 NOTE — Op Note (Addendum)
07/16/2014  7:41 AM  PATIENT:  Tommy Lee  43 y.o. male  PRE-OPERATIVE DIAGNOSIS:  Right ulnar impaction syndrome  POST-OPERATIVE DIAGNOSIS:  Same  PROCEDURE:  Right ulnar shortening osteotomy  SURGEON: Cliffton Astersavid A. Janee Mornhompson, MD  PHYSICIAN ASSISTANT: Danielle RankinKirsten Schrader, OPA-C  ANESTHESIA:  regional and general  SPECIMENS:  None  DRAINS:   None  PREOPERATIVE INDICATIONS:  Tommy Lee is a  43 y.o. male with history of open treatment of right distal radius fracture. It has healed, and now is noted to be several millimeters ulnar positive with continued ulnar-sided wrist pain.  The risks benefits and alternatives were discussed with the patient preoperatively including but not limited to the risks of infection, bleeding, nerve injury, cardiopulmonary complications, the need for revision surgery, among others, and the patient verbalized understanding and consented to proceed.  OPERATIVE IMPLANTS: Acumed and ulnar osteotomy plate and screws  OPERATIVE PROCEDURE:  After receiving prophylactic antibiotic and a regional block s, the patient was escorted to the operative theatre and placed in a supine position. General anesthesia was administered. A surgical "time-out" was performed during which the planned procedure, proposed operative site, and the correct patient identity were compared to the operative consent and agreement confirmed by the circulating nurse according to current facility policy.  Following application of a tourniquet to the operative extremity, the exposed skin was prepped with Chloraprep and draped in the usual sterile fashion.  The limb was exsanguinated with an Esmarch bandage and the tourniquet inflated to approximately 100mmHg higher than systolic BP.  A direct linear ulnar longitudinal incision was made sharply with a scalpel, subcutaneous tissues dissected with blunt, spreading, and sharp dissection. The subcutaneous border ulna was exposed sharply, with subperiosteal  dissection carried volarly to expose the volar shaft of the ulna. The plate was selected and provisionally applied. The distal holes filled after the plate was precontoured so that he would lie flat against the surface of the bone.  The proximal fixation device was placed. The distal 2 screw holes were predrilled and measured. The jig was applied, stabilized with K wires, and the first cut made with the jig set on #1. A second cut was then made with the jig set half way between numbers 4 and 5. Care was taken to protect the soft tissues on the deep side of the osteotomy with baby Bennett retractors. The resection bone was removed. The proximal fixation device was loosened and the osteotomy compressed. It appeared gap slightly on the surface away from the plate, so the plate was removed and recontoured. Once reapplied, the gapping was much improved. The screw was placed in hole #3, then with the osteotomy compressed and clamped, the second proximal screw was placed. The compressing screw was placed in standard fashion. Final holes were drilled and filled with a mixture locking and nonlocking screws and although provisional fluoroscopic assessment had been done, final fluoroscopic images were obtained. Some of very small bone graft that had been saved from the saw cut was then placed along the osteotomy on the surface that was exposed after everything and then copiously irrigated. The deep fascia was closed with running 2-0 Vicryl suture, tourniquet released, and the wound again irrigated. The skin was closed with deep dermal 2-0 Vicryl buried sutures and running 4-0 Vicryl Rapide horizontal mattress suture. A short arm splint dressing was applied and he was awakened and taken to room stable condition, breathing spontaneously  DISPOSITION: He'll be discharged home today with typical instructions and analgesics, returning approximately  2 weeks at which time he should have new x-rays of the right wrist out of the  splint and be transitioned to hand therapy for a custom splint construction and reinitiation of therapy.

## 2014-07-16 NOTE — Progress Notes (Signed)
Assisted Dr. Edmond Fitzgerald with right, ultrasound guided, interscalene  block. Side rails up, monitors on throughout procedure. See vital signs in flow sheet. Tolerated Procedure well. 

## 2014-07-16 NOTE — Transfer of Care (Signed)
Immediate Anesthesia Transfer of Care Note  Patient: Tommy Lee  Procedure(s) Performed: Procedure(s): RIGHT ULNAR SHORTENING OSTEOTOMY (Right)  Patient Location: PACU  Anesthesia Type:GA combined with regional for post-op pain  Level of Consciousness: awake and patient cooperative  Airway & Oxygen Therapy: Patient Spontanous Breathing and Patient connected to face mask oxygen  Post-op Assessment: Report given to PACU RN and Post -op Vital signs reviewed and stable  Post vital signs: Reviewed and stable  Complications: No apparent anesthesia complications

## 2014-07-16 NOTE — Anesthesia Postprocedure Evaluation (Signed)
  Anesthesia Post-op Note  Patient: Tommy Lee  Procedure(s) Performed: Procedure(s): RIGHT ULNAR SHORTENING OSTEOTOMY (Right)  Patient Location: PACU  Anesthesia Type:General and block  Level of Consciousness: awake and alert   Airway and Oxygen Therapy: Patient Spontanous Breathing  Post-op Pain: mild  Post-op Assessment: Post-op Vital signs reviewed, Patient's Cardiovascular Status Stable and Respiratory Function Stable  Post-op Vital Signs: Reviewed  Filed Vitals:   07/16/14 1054  BP:   Pulse: 95  Temp:   Resp: 17    Complications: No apparent anesthesia complications

## 2014-07-17 ENCOUNTER — Encounter (HOSPITAL_BASED_OUTPATIENT_CLINIC_OR_DEPARTMENT_OTHER): Payer: Self-pay | Admitting: Orthopedic Surgery

## 2015-05-04 IMAGING — CT CT ELBOW*L* W/O CM
2 of 3 series · 7 of 20 positions shown, 9 images · non-contrast
Comparison: Plain films of the left elbow 10/31/2013 at 5151 hr.

CLINICAL DATA: Status post fall. Left elbow pain. Radial head
fracture.

EXAM:
CT OF THE LEFT ELBOW WITHOUT CONTRAST
TECHNIQUE: Multidetector CT imaging was performed according to the standard
protocol. Multiplanar CT image reconstructions were also generated.

[Series 4: soft tissue · axial · 0.34mm/px · z∈[-162,-62]mm · 6 of 54 slices shown, 8 images]
[im 9/54  soft-tissue]
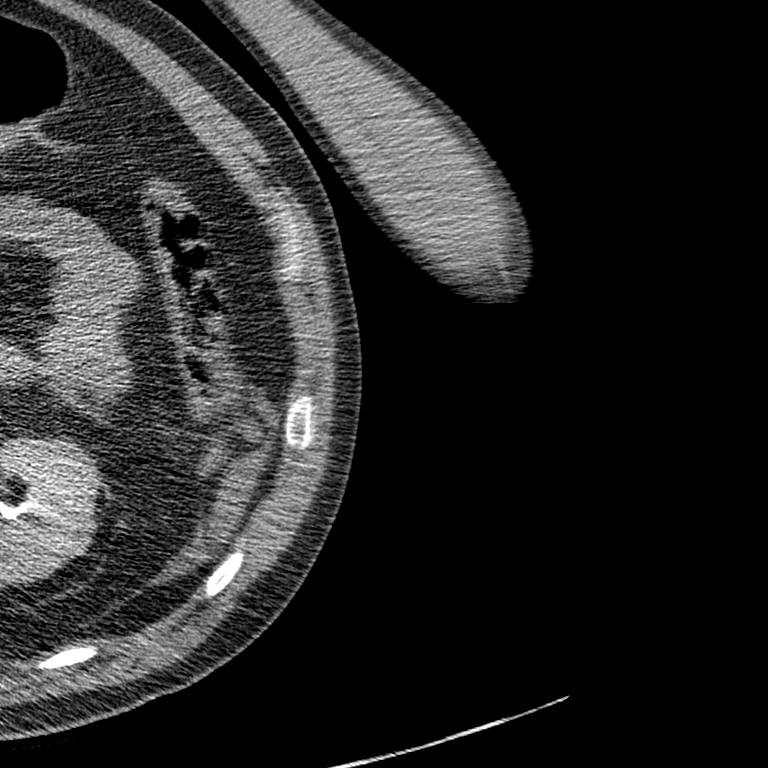
[im 9/54  bone]
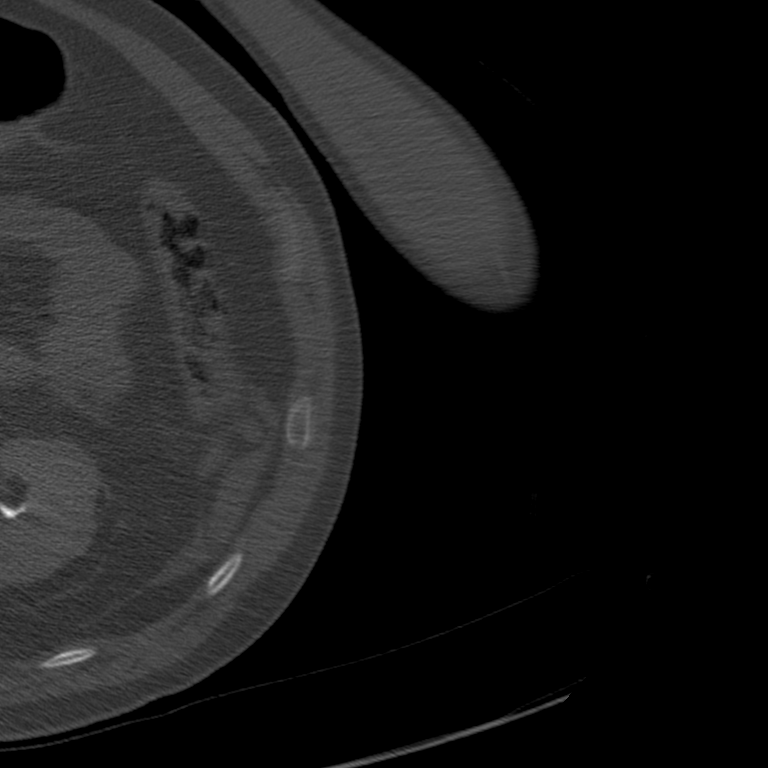
[im 17/54  bone]
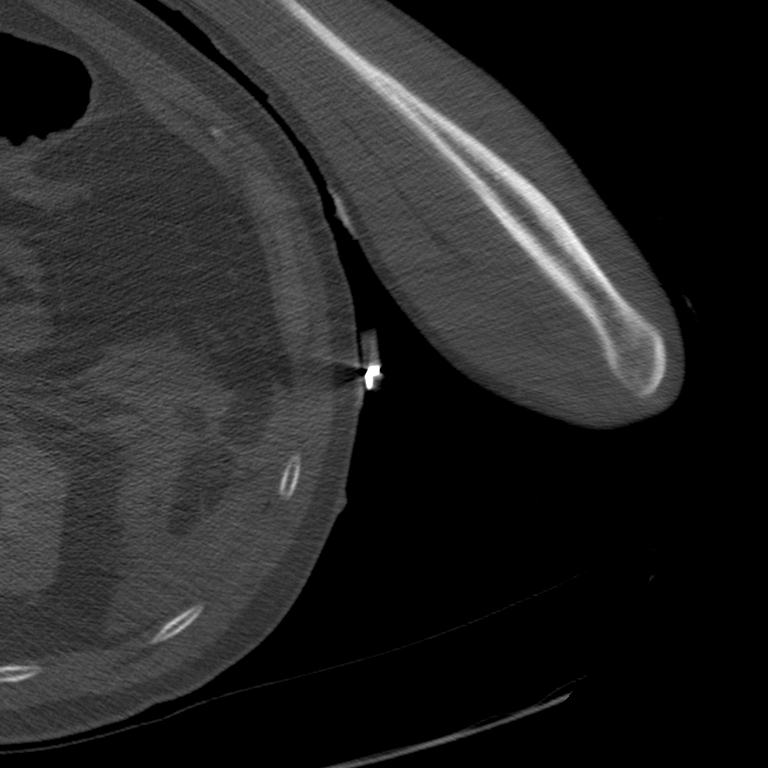
[im 25/54  bone]
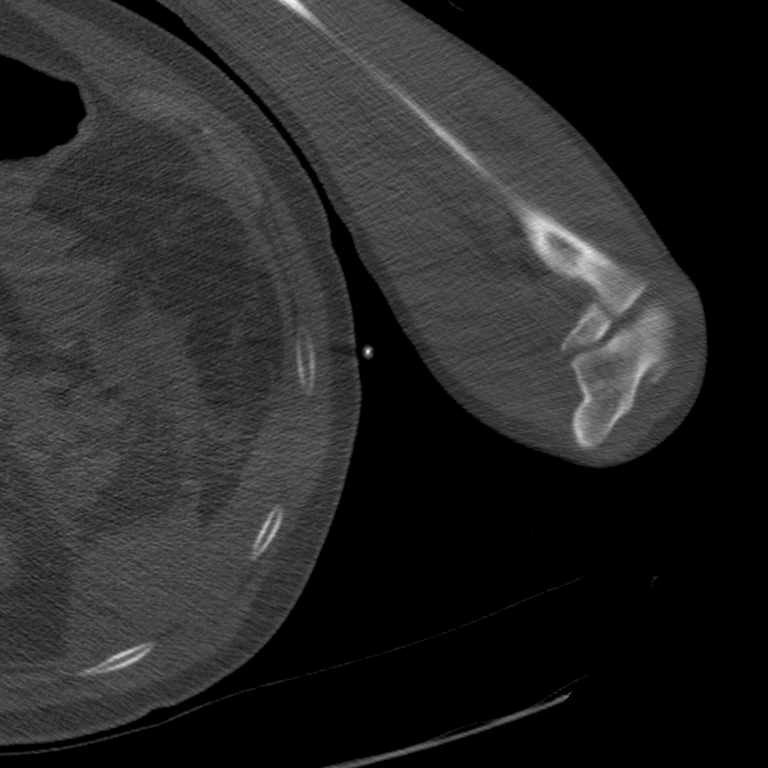
[im 33/54  bone]
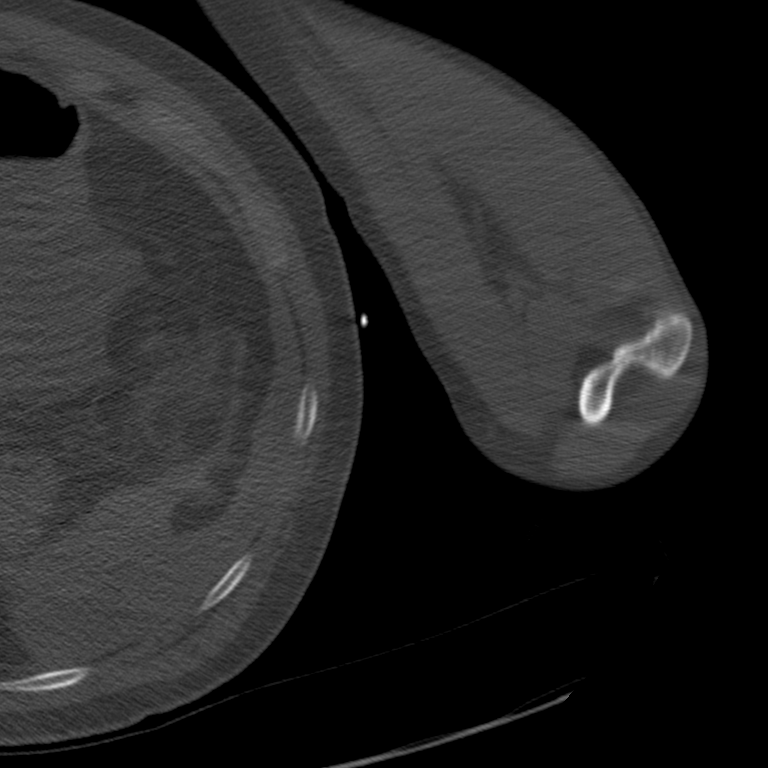
[im 41/54  soft-tissue]
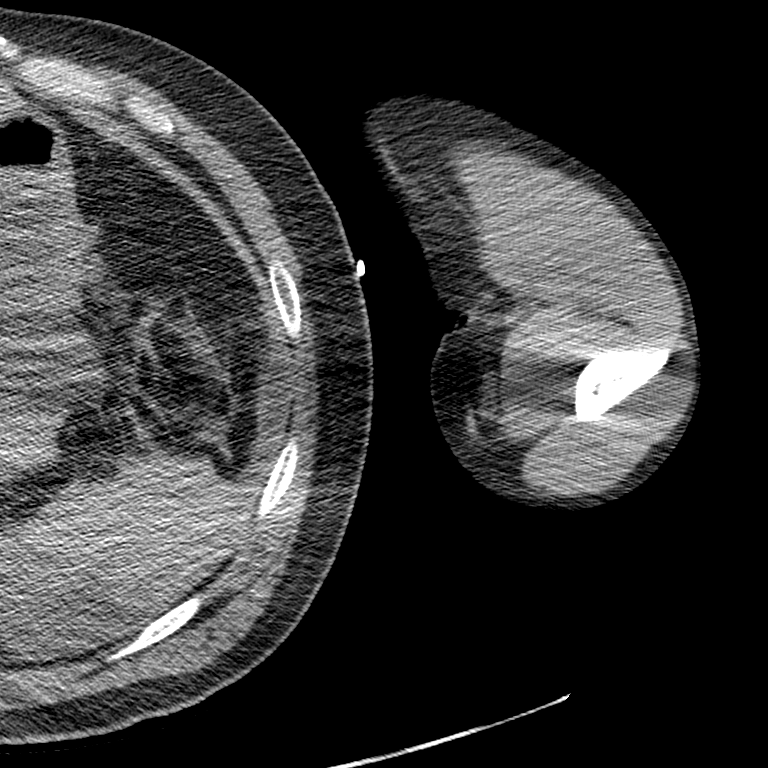
[im 41/54  bone]
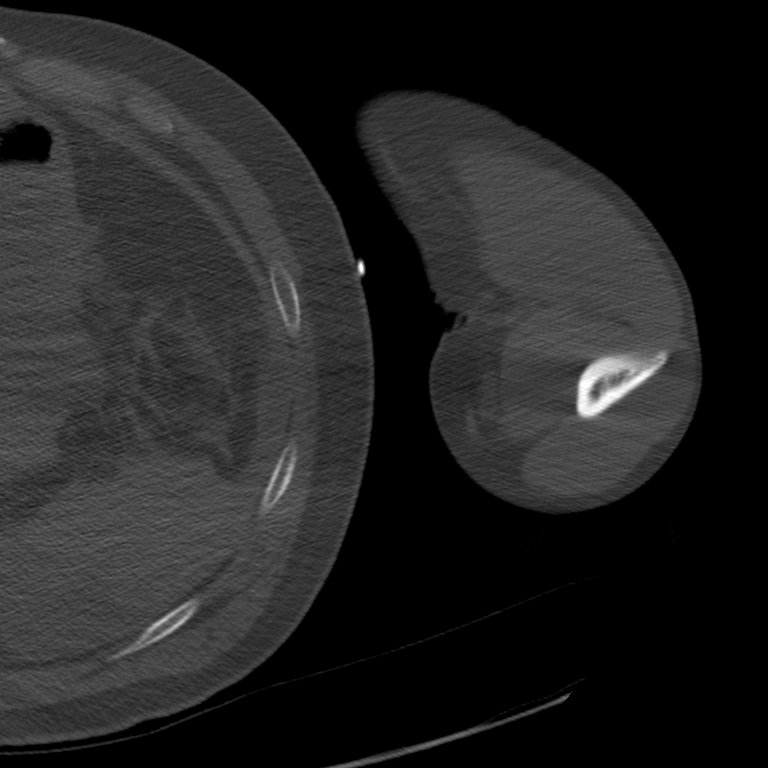
[im 49/54  bone]
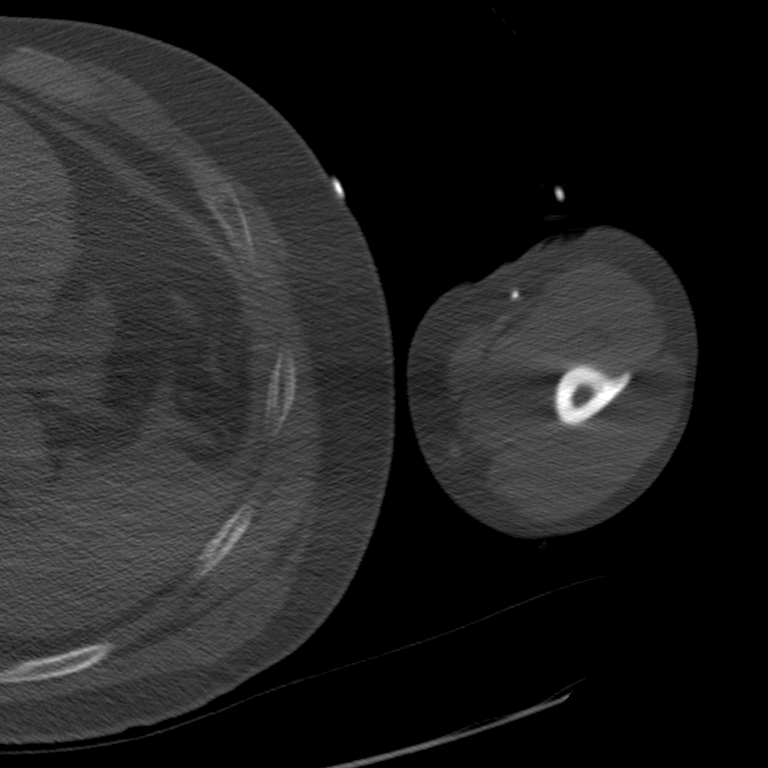

[axial 2 lt elbow · coronal · 0.33mm/px · 1 of 50 slices shown]
[im 25/50  bone]
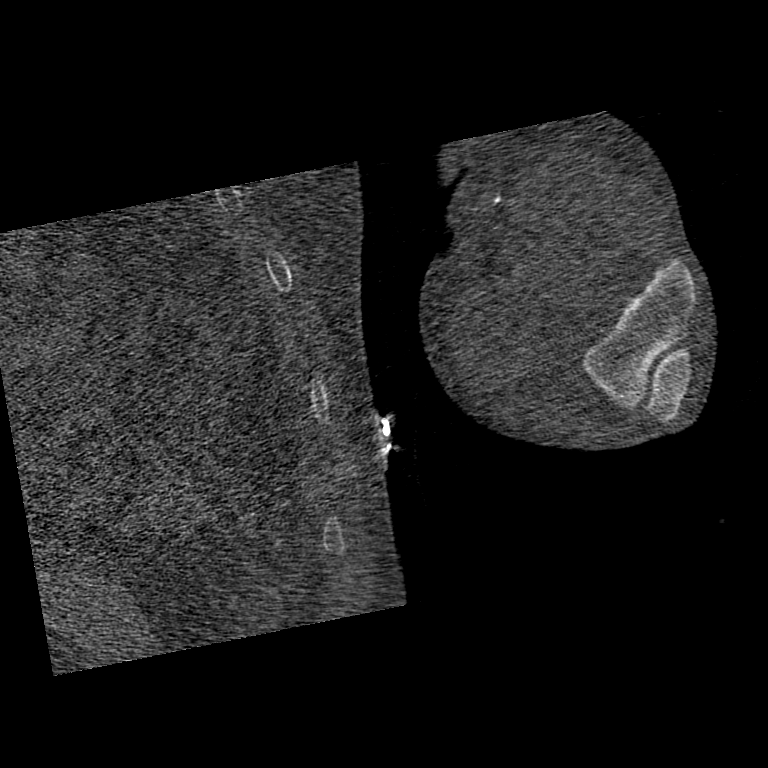

[7 of 20 positions shown; findings below may reference images not displayed]

FINDINGS: The patient has a fracture of the coronoid process of the ulna with
minimal distraction of 0.3 cm. Also seen is a fracture of the radial
head. The fracture is transverse in orientation through the junction
of the anterior and posterior thirds of the radial head. The
fracture shows minimal impaction of 0.1 - 0.2 cm. No loose body is
seen within the joint. No other fracture is identified. The elbow is
located. Joint effusion associated short the patient's fractures is
noted.
IMPRESSION: Minimally distracted coronoid process fracture of the ulna. Slightly
impacted fracture of the anterior radial head is also identified as
described above. Associated joint effusion is noted.

## 2015-05-05 IMAGING — CR DG CERVICAL SPINE FLEX&EXT ONLY
2 series · 2 of 2 positions shown · non-contrast
Comparison: CT from the previous day

CLINICAL DATA: Neck pain

EXAM:
CERVICAL SPINE - FLEXION AND EXTENSION VIEWS ONLY

[w c-spine extension]
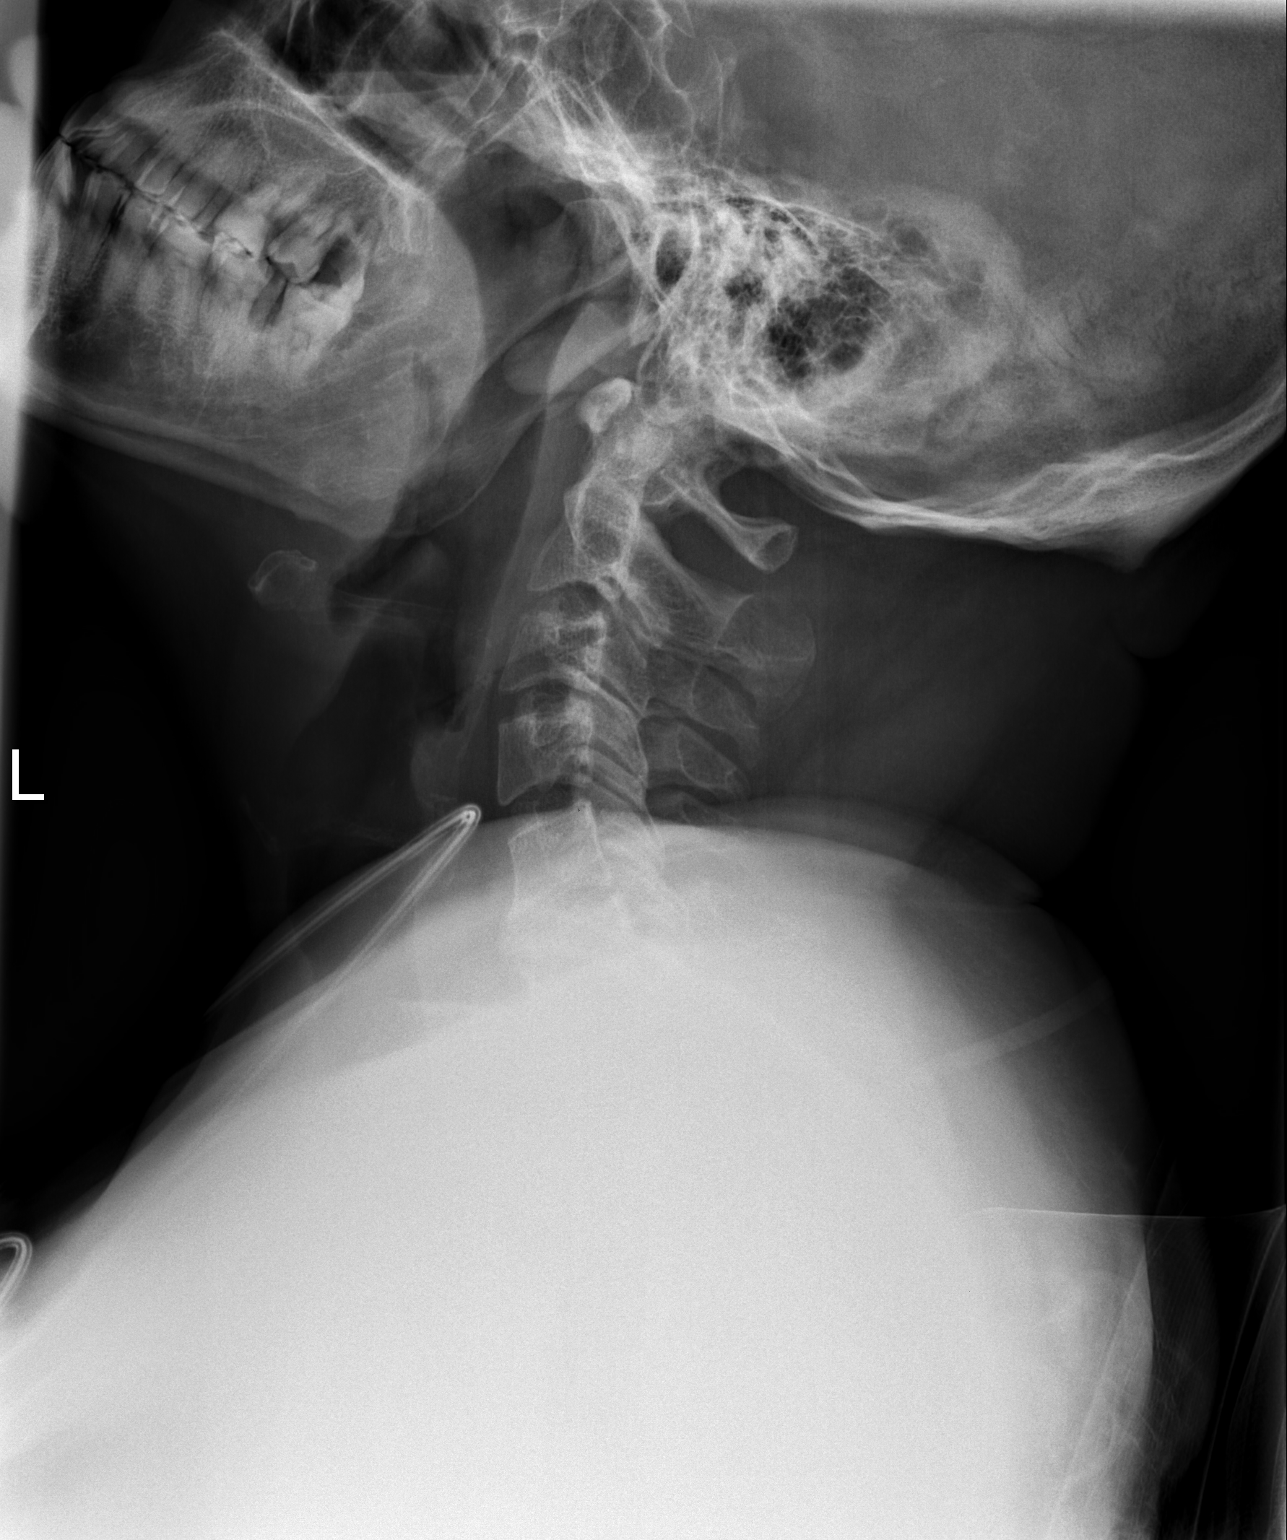

[w c-spine flexion]
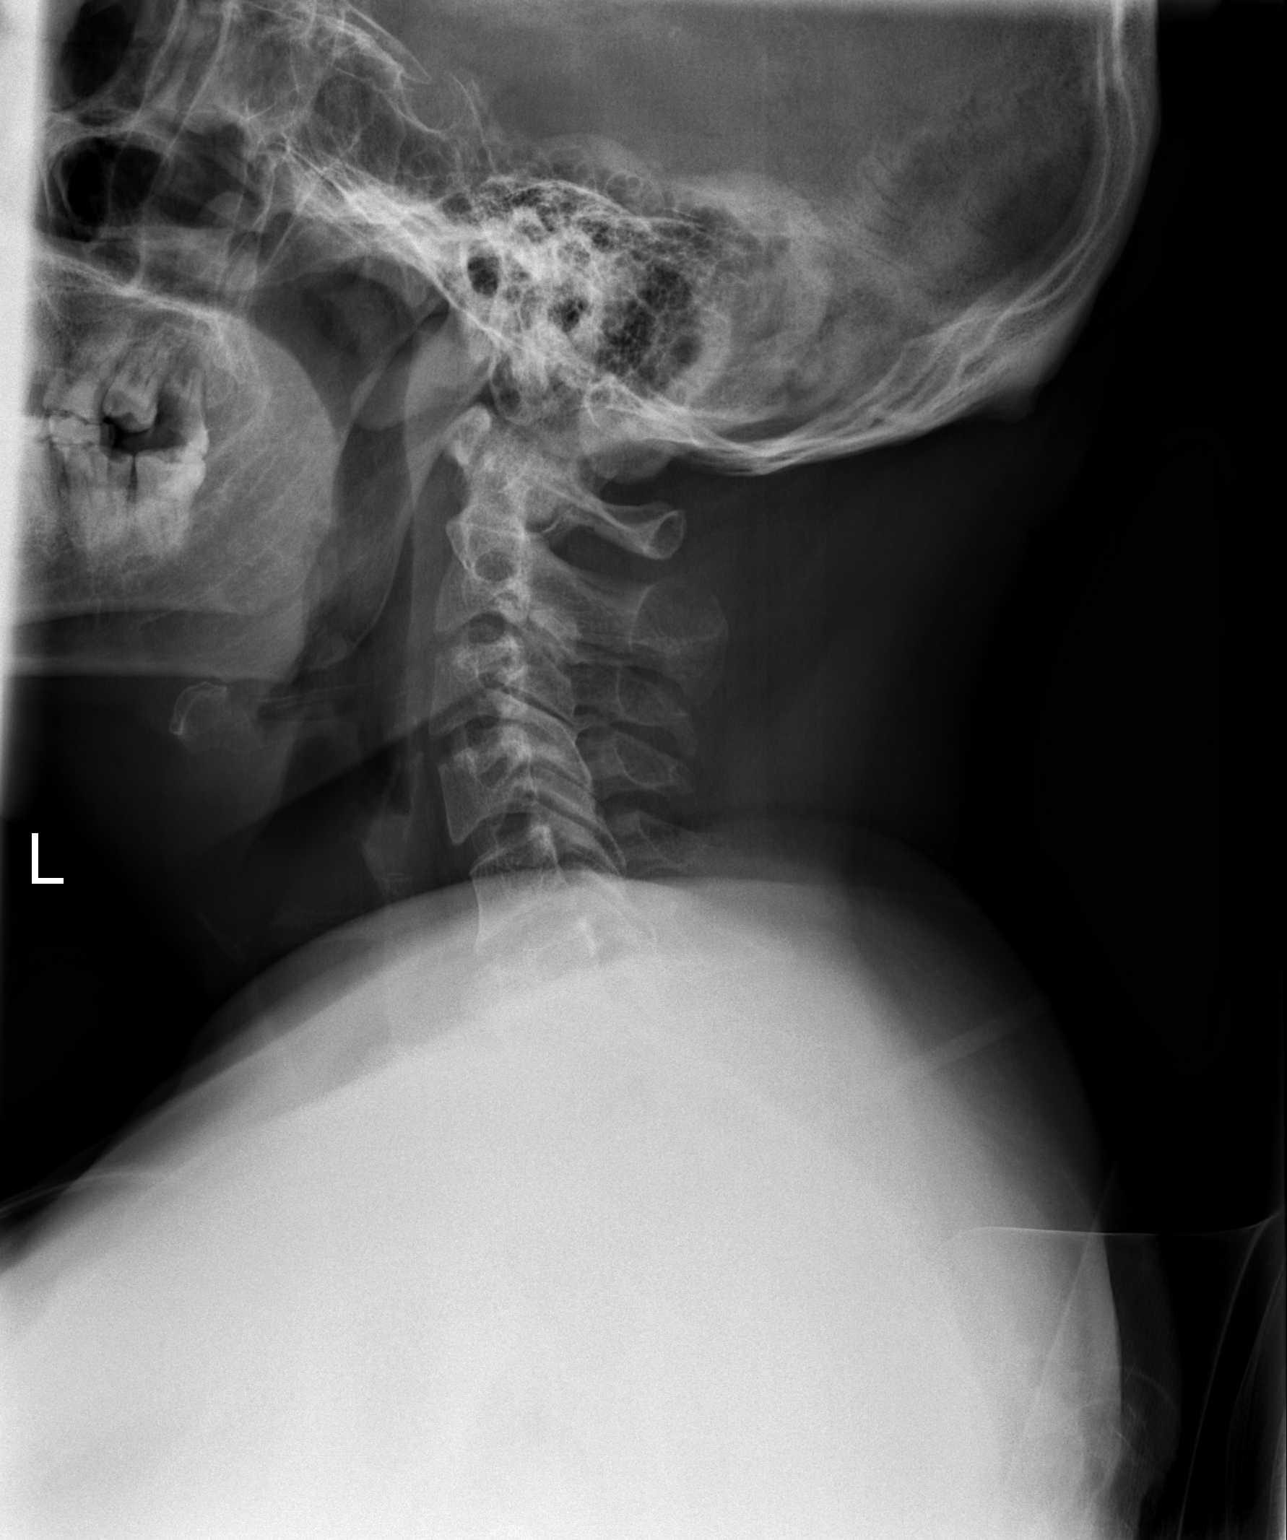

[2 of 2 positions shown; findings below may reference images not displayed]

FINDINGS: Flexion and extension views of the cervical spine were obtained in
or somewhat limited as only 5 cervical vertebra are well visualized.
No prevertebral soft tissue swelling is noted. Mild degenerative
changes are noted at C5-6. C6 and C7 are incompletely evaluated.
IMPRESSION: Limited exam although no gross abnormality is seen.

## 2016-01-17 IMAGING — RF DG WRIST COMPLETE 3+V*R*
1 series · 3 of 3 positions shown · non-contrast
Comparison: 10/31/2013

CLINICAL DATA: Subsequent encounter for right ulnar shortening
osteotomy.

EXAM:
RIGHT WRIST - COMPLETE 3+ VIEW; DG C-ARM 61-120 MIN

[Series 1: run · 3 of 3 slices shown]
[im 1/3]
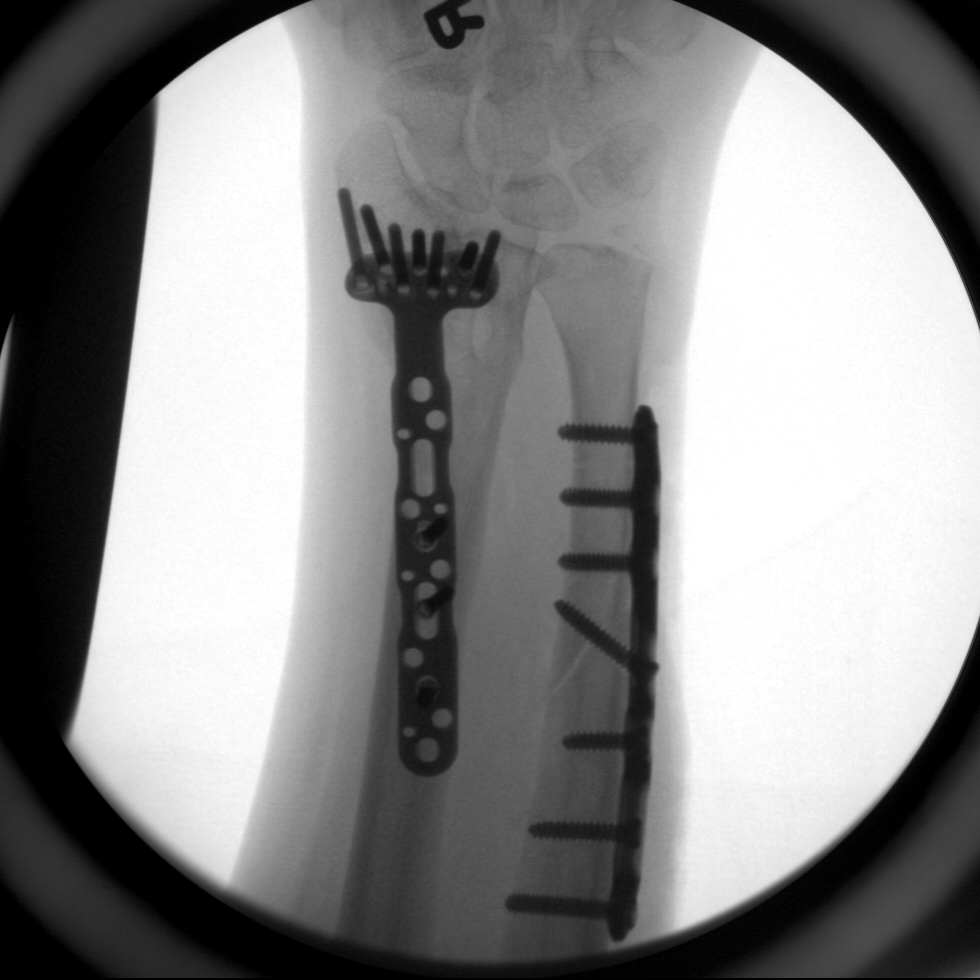
[im 2/3]
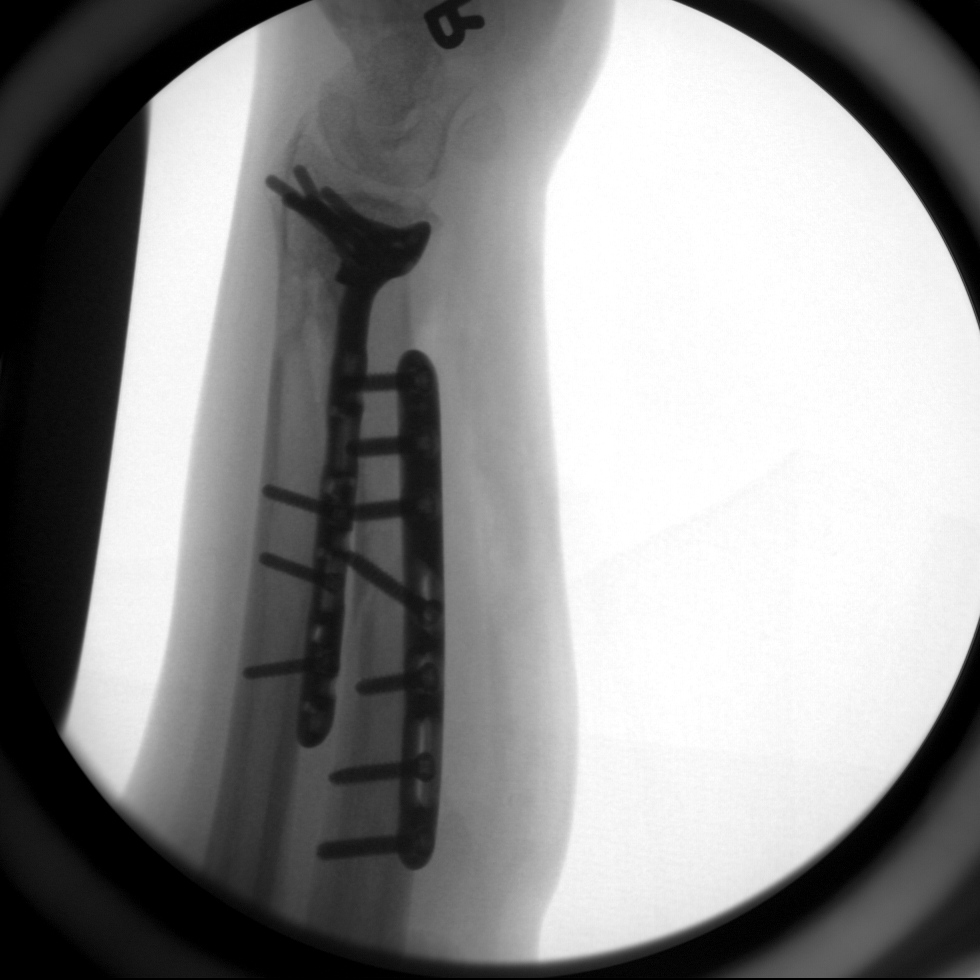
[im 3/3]
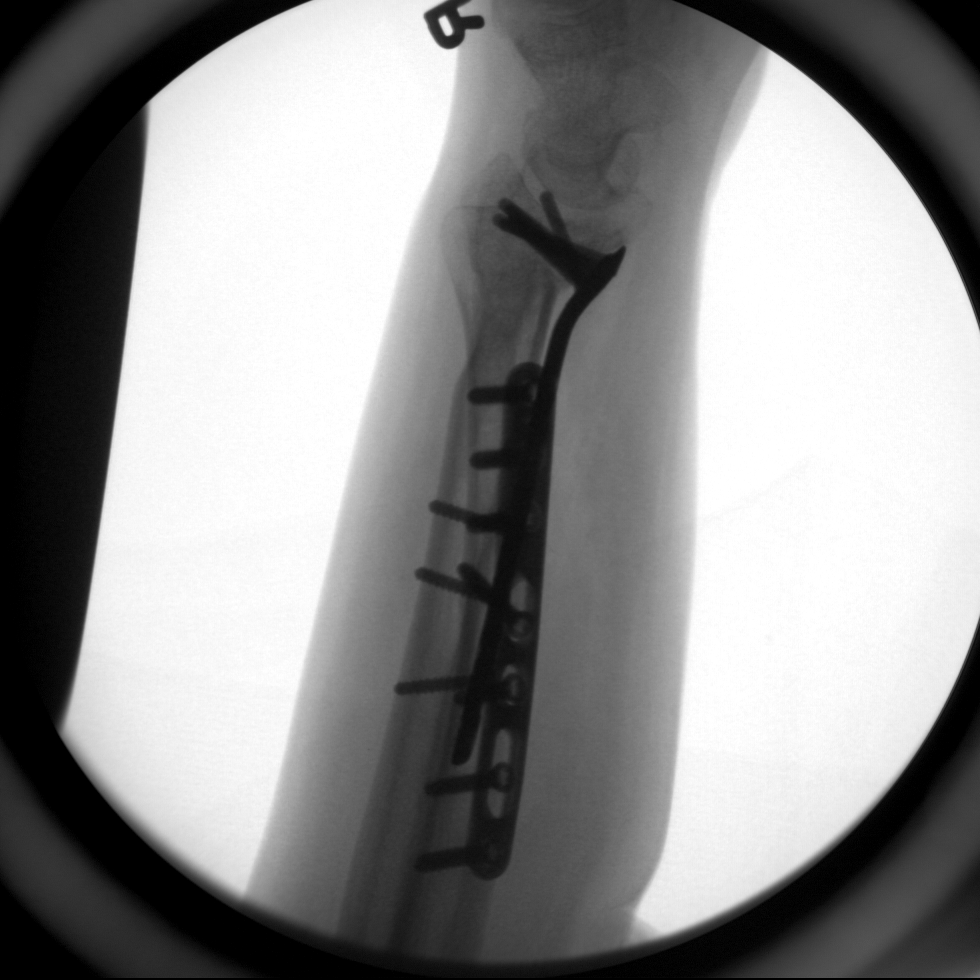

[3 of 3 positions shown; findings below may reference images not displayed]

FINDINGS: Three spot fluoro images are submitted after the procedure. Fixation
hardware is again noted in the distal radius with interval healing
of the comminuted distal radius fracture. Plate and screw fixation
device is seen along the medial cortex of the distal ulna. No
evidence for immediate hardware complications.
IMPRESSION: Interoperative localization.

## 2018-08-05 ENCOUNTER — Emergency Department (HOSPITAL_COMMUNITY): Payer: Self-pay

## 2018-08-05 ENCOUNTER — Emergency Department (HOSPITAL_COMMUNITY)
Admission: EM | Admit: 2018-08-05 | Discharge: 2018-08-06 | Disposition: A | Discharge status: Home / Self Care | Payer: Self-pay | Attending: Emergency Medicine | Admitting: Emergency Medicine

## 2018-08-05 DIAGNOSIS — F10929 Alcohol use, unspecified with intoxication, unspecified: Secondary | ICD-10-CM

## 2018-08-05 DIAGNOSIS — F10229 Alcohol dependence with intoxication, unspecified: Secondary | ICD-10-CM | POA: Insufficient documentation

## 2018-08-05 DIAGNOSIS — R4182 Altered mental status, unspecified: Secondary | ICD-10-CM

## 2018-08-05 DIAGNOSIS — Y908 Blood alcohol level of 240 mg/100 ml or more: Secondary | ICD-10-CM | POA: Insufficient documentation

## 2018-08-05 DIAGNOSIS — E876 Hypokalemia: Secondary | ICD-10-CM | POA: Insufficient documentation

## 2018-08-05 DIAGNOSIS — R1110 Vomiting, unspecified: Secondary | ICD-10-CM

## 2018-08-05 LAB — CBC WITH DIFFERENTIAL/PLATELET
Abs Immature Granulocytes: 0.06 10*3/uL (ref 0.00–0.07)
Basophils Absolute: 0 10*3/uL (ref 0.0–0.1)
Basophils Relative: 0 %
EOS ABS: 0.1 10*3/uL (ref 0.0–0.5)
Eosinophils Relative: 1 %
HEMATOCRIT: 43.2 % (ref 39.0–52.0)
Hemoglobin: 14.4 g/dL (ref 13.0–17.0)
IMMATURE GRANULOCYTES: 1 %
LYMPHS ABS: 1.1 10*3/uL (ref 0.7–4.0)
Lymphocytes Relative: 11 %
MCH: 32.1 pg (ref 26.0–34.0)
MCHC: 33.3 g/dL (ref 30.0–36.0)
MCV: 96.2 fL (ref 80.0–100.0)
MONO ABS: 0.4 10*3/uL (ref 0.1–1.0)
Monocytes Relative: 4 %
Neutro Abs: 8.9 10*3/uL — ABNORMAL HIGH (ref 1.7–7.7)
Neutrophils Relative %: 83 %
Platelets: 274 10*3/uL (ref 150–400)
RBC: 4.49 MIL/uL (ref 4.22–5.81)
RDW: 13.2 % (ref 11.5–15.5)
WBC: 10.5 10*3/uL (ref 4.0–10.5)
nRBC: 0 % (ref 0.0–0.2)

## 2018-08-05 LAB — I-STAT CG4 LACTIC ACID, ED: Lactic Acid, Venous: 2.99 mmol/L (ref 0.5–1.9)

## 2018-08-05 MED ORDER — SODIUM CHLORIDE 0.9 % IV BOLUS
1000.0000 mL | Freq: Once | INTRAVENOUS | Status: AC
  Administered 2018-08-05: 1000 mL via INTRAVENOUS

## 2018-08-05 NOTE — ED Notes (Signed)
I gave critical I Stat CG4 result to MD Little 

## 2018-08-05 NOTE — ED Notes (Signed)
Patient's clothing covered in large chunks of vomit,urine and large amount stool-clothing cut and removed-patient is unresponsive at this time. Belongings placed in belongings bag and sweat pants full of large amount soft stool discarded in trash.

## 2018-08-05 NOTE — ED Provider Notes (Signed)
[facility]-EMERGENCY DEPT Provider Note   CSN: [id number] Arrival date & time: 08/05/18  2229     History   Chief Complaint Chief Complaint  Patient presents with  . Alcohol Intoxication    HPI Tommy Lee is a 47 y.o. male.  Unknown age male with unknown past medical history who presents with altered mental status.  EMS reports that patient was found outside of an apartment complex on the ground, altered.  They brought him inside and called EMS.  EMS notes that he has had episodes of vomiting for which they gave him Zofran.  He has not been able to provide any history.  When asked in Spanish, "What is your age?" He states "21." And when asked "do you drink beer?" He states "12."  LEVEL 5 CAVEAT DUE TO AMS  The history is provided by the EMS personnel.    No past medical history on file.  There are no active problems to display for this patient.   ** The histories are not reviewed yet. Please review them in the "History" navigator section and refresh this SmartLink.      Home Medications    Prior to Admission medications   Not on File    Family History No family history on file.  Social History Social History   Tobacco Use  . Smoking status: Not on file  Substance Use Topics  . Alcohol use: Not on file  . Drug use: Not on file     Allergies   Patient has no allergy information on record.   Review of Systems Review of Systems  Unable to perform ROS: Mental status change     Physical Exam Updated Vital Signs BP 91/65   Pulse 81   Temp (!) 97.5 F (36.4 C) (Rectal)   Resp 17   SpO2 94%   Physical Exam  Constitutional: He appears well-developed and well-nourished. No distress.  Ill appearing, somnolent, covered in emesis and feces  HENT:  Head: Normocephalic and atraumatic.  Moist mucous membranes  Eyes: Pupils are equal, round, and reactive to light. Conjunctivae are normal.  Dilated pupils  Neck: Neck  supple.  Cardiovascular: Normal rate, regular rhythm and normal heart sounds.  No murmur heard. Pulmonary/Chest: Effort normal and breath sounds normal.  Abdominal: Soft. Bowel sounds are normal. He exhibits no distension. There is no tenderness.  Musculoskeletal: He exhibits no edema.  Neurological:  Awakens to sternal rub, able to state name, able to follow 1-step commands  Skin: Skin is warm and dry.  Psychiatric:  intoxicated  Nursing note and vitals reviewed.    ED Treatments / Results  Labs (all labs ordered are listed, but only abnormal results are displayed) Labs Reviewed  ACETAMINOPHEN LEVEL - Abnormal; Notable for the following components:      Result Value   Acetaminophen (Tylenol), Serum <10 (*)    All other components within normal limits  COMPREHENSIVE METABOLIC PANEL - Abnormal; Notable for the following components:   Potassium 2.8 (*)    Glucose, Bld 137 (*)    Calcium 8.2 (*)    GFR calc non Af Amer 56 (*)    All other components within normal limits  CBC WITH DIFFERENTIAL/PLATELET - Abnormal; Notable for the following components:   Neutro Abs 8.9 (*)    All other components within normal limits  ETHANOL - Abnormal; Notable for the following components:   Alcohol, Ethyl (B) 256 (*)    All other components within normal  limits  I-STAT CG4 LACTIC ACID, ED - Abnormal; Notable for the following components:   Lactic Acid, Venous 2.99 (*)    All other components within normal limits  SALICYLATE LEVEL  RAPID URINE DRUG SCREEN, HOSP PERFORMED  I-STAT CG4 LACTIC ACID, ED    EKG None  Radiology Ct Head Wo Contrast  Result Date: 08/05/2018 CLINICAL DATA:  Acute onset of unresponsiveness. EXAM: CT HEAD WITHOUT CONTRAST TECHNIQUE: Contiguous axial images were obtained from the base of the skull through the vertex without intravenous contrast. COMPARISON:  None. FINDINGS: Brain: No evidence of acute infarction, hemorrhage, hydrocephalus, extra-axial collection or  mass lesion/mass effect. Postoperative change is noted at the left frontal lobe, with associated encephalomalacia. Mild cerebellar atrophy is noted. The brainstem and fourth ventricle are within normal limits. The third and lateral ventricles, and basal ganglia are unremarkable in appearance. The cerebral hemispheres are symmetric in appearance, with normal gray-white differentiation. No mass effect or midline shift is seen. Vascular: No hyperdense vessel or unexpected calcification. Skull: There is no evidence of fracture; visualized osseous structures are unremarkable in appearance. Sinuses/Orbits: The visualized portions of the orbits are within normal limits. The paranasal sinuses and mastoid air cells are well-aerated. Other: No significant soft tissue abnormalities are seen. IMPRESSION: 1. No acute intracranial pathology seen on CT. 2. Postoperative change at the left frontal lobe, with associated encephalomalacia. Electronically Signed   By: Roanna Raider M.D.   On: 08/05/2018 23:53    Procedures Procedures (including critical care time)  Medications Ordered in ED Medications  sodium chloride 0.9 % bolus 1,000 mL (has no administration in time range)  sodium chloride 0.9 % bolus 1,000 mL (0 mLs Intravenous Stopped 08/06/18 0032)     Initial Impression / Assessment and Plan / ED Course  I have reviewed the triage vital signs and the nursing notes.  Pertinent labs & imaging results that were available during my care of the patient were reviewed by me and considered in my medical decision making (see chart for details).    VSS on arrival, covered in emesis but breathing comfortably on RA. Initial lactate 2.99, gave zofran and IVF bolus. No change with Narcan. Head CT negative acute, does show post-operative changes in left frontal lobe. K 2.8. BAL 256 which likely explains his AMS.  I am signing the patient out to the oncoming provider, Dr. Preston Fleeting.  We will allow the patient to metabolize and  reassess his mental status.  If his mental status improves and he normalizes, I anticipate he will be appropriate for discharge.  Final Clinical Impressions(s) / ED Diagnoses   Final diagnoses:  None    ED Discharge Orders    None       Dontrez Pettis, Ambrose Finland, MD 08/06/18 [phone]

## 2018-08-05 NOTE — ED Notes (Signed)
Bed: RESA Expected date:  Expected time:  Means of arrival:  Comments: EMS unresponsive intoxicated

## 2018-08-05 NOTE — ED Triage Notes (Signed)
Pt presents via EMS for alcohol intoxication. Patient is spanish speaking, name unknown. Patient actively vomiting, covered in vomit, urine and feces. EDP at bedside.

## 2018-08-05 NOTE — ED Notes (Signed)
Unable to complete triage due to patient being unresponsive.

## 2018-08-06 LAB — RAPID URINE DRUG SCREEN, HOSP PERFORMED
AMPHETAMINES: NOT DETECTED
BENZODIAZEPINES: NOT DETECTED
Barbiturates: NOT DETECTED
Cocaine: NOT DETECTED
Opiates: NOT DETECTED
Tetrahydrocannabinol: NOT DETECTED

## 2018-08-06 LAB — COMPREHENSIVE METABOLIC PANEL
ALT: 32 U/L (ref 0–44)
ANION GAP: 13 (ref 5–15)
AST: 20 U/L (ref 15–41)
Albumin: 4 g/dL (ref 3.5–5.0)
Alkaline Phosphatase: 78 U/L (ref 38–126)
BUN: 14 mg/dL (ref 6–20)
CHLORIDE: 108 mmol/L (ref 98–111)
CO2: 22 mmol/L (ref 22–32)
Calcium: 8.2 mg/dL — ABNORMAL LOW (ref 8.9–10.3)
Creatinine, Ser: 0.67 mg/dL (ref 0.61–1.24)
GFR calc non Af Amer: 56 mL/min — ABNORMAL LOW (ref >60)
Glucose, Bld: 137 mg/dL — ABNORMAL HIGH (ref 70–99)
Potassium: 2.8 mmol/L — ABNORMAL LOW (ref 3.5–5.1)
SODIUM: 143 mmol/L (ref 135–145)
Total Bilirubin: 0.3 mg/dL (ref 0.3–1.2)
Total Protein: 7.3 g/dL (ref 6.5–8.1)

## 2018-08-06 LAB — I-STAT CG4 LACTIC ACID, ED: Lactic Acid, Venous: 2.36 mmol/L (ref 0.5–1.9)

## 2018-08-06 LAB — ETHANOL: ALCOHOL ETHYL (B): 256 mg/dL — AB (ref <10)

## 2018-08-06 LAB — ACETAMINOPHEN LEVEL

## 2018-08-06 LAB — SALICYLATE LEVEL: Salicylate Lvl: <7 mg/dL (ref 2.8–30.0)

## 2018-08-06 MED ORDER — POTASSIUM CHLORIDE CRYS ER 20 MEQ PO TBCR: 40.0000 meq | EXTENDED_RELEASE_TABLET | Freq: Three times a day (TID) | ORAL | 0 refills | Status: DC

## 2018-08-06 MED ORDER — SODIUM CHLORIDE 0.9 % IV BOLUS
1000.0000 mL | Freq: Once | INTRAVENOUS | Status: AC
  Administered 2018-08-06: 1000 mL via INTRAVENOUS

## 2018-08-06 MED ORDER — POTASSIUM CHLORIDE CRYS ER 20 MEQ PO TBCR: 40.0000 meq | EXTENDED_RELEASE_TABLET | Freq: Three times a day (TID) | ORAL | 0 refills | Status: AC

## 2018-08-06 MED ORDER — POTASSIUM CHLORIDE CRYS ER 20 MEQ PO TBCR
40.0000 meq | EXTENDED_RELEASE_TABLET | Freq: Once | ORAL | Status: AC
  Administered 2018-08-06: 40 meq via ORAL
  Filled 2018-08-06: qty 2

## 2018-08-06 NOTE — ED Notes (Signed)
Pt wet clothes were given to pt at d/c

## 2018-08-06 NOTE — ED Notes (Signed)
Patient is being provided blue scrubs and socks to change into before discharge.

## 2018-08-06 NOTE — ED Notes (Signed)
I gave critical I Stat CG4 result to MD Preston Fleeting

## 2018-08-06 NOTE — ED Provider Notes (Signed)
Care assumed from Dr. Clarene Duke, patient presented with severe ethanol intoxication, needed to be observed in the ED until he woke up.  He has been sleeping soundly through the night.  He is now awake and alert and able to answer questions.  Labs are reviewed and lactic acid level was initially elevated at 2.99, had come down to 2.36 with hydration.  Initial potassium was 2.8.  He had not received any potassium supplementation, he is given a dose of oral potassium.  He will be discharged with prescription for K-Dur to take over the next 4 days to try to build up potassium reserves.  Results for orders placed or performed during the hospital encounter of 08/05/18  Acetaminophen level  Result Value Ref Range   Acetaminophen (Tylenol), Serum <10 (L) 10 - 30 ug/mL  Comprehensive metabolic panel  Result Value Ref Range   Sodium 143 135 - 145 mmol/L   Potassium 2.8 (L) 3.5 - 5.1 mmol/L   Chloride 108 98 - 111 mmol/L   CO2 22 22 - 32 mmol/L   Glucose, Bld 137 (H) 70 - 99 mg/dL   BUN 14 8 - 23 mg/dL   Creatinine, Ser 1.61 0.61 - 1.24 mg/dL   Calcium 8.2 (L) 8.9 - 10.3 mg/dL   Total Protein 7.3 6.5 - 8.1 g/dL   Albumin 4.0 3.5 - 5.0 g/dL   AST 20 15 - 41 U/L   ALT 32 0 - 44 U/L   Alkaline Phosphatase 78 38 - 126 U/L   Total Bilirubin 0.3 0.3 - 1.2 mg/dL   GFR calc non Af Amer 56 (L) >60 mL/min   GFR calc Af Amer >60 >60 mL/min   Anion gap 13 5 - 15  Salicylate level  Result Value Ref Range   Salicylate Lvl <7.0 2.8 - 30.0 mg/dL  CBC with Differential  Result Value Ref Range   WBC 10.5 4.0 - 10.5 K/uL   RBC 4.49 4.22 - 5.81 MIL/uL   Hemoglobin 14.4 13.0 - 17.0 g/dL   HCT 09.6 04.5 - 40.9 %   MCV 96.2 80.0 - 100.0 fL   MCH 32.1 26.0 - 34.0 pg   MCHC 33.3 30.0 - 36.0 g/dL   RDW 81.1 91.4 - 78.2 %   Platelets 274 150 - 400 K/uL   nRBC 0.0 0.0 - 0.2 %   Neutrophils Relative % 83 %   Neutro Abs 8.9 (H) 1.7 - 7.7 K/uL   Lymphocytes Relative 11 %   Lymphs Abs 1.1 0.7 - 4.0 K/uL   Monocytes  Relative 4 %   Monocytes Absolute 0.4 0.1 - 1.0 K/uL   Eosinophils Relative 1 %   Eosinophils Absolute 0.1 0.0 - 0.5 K/uL   Basophils Relative 0 %   Basophils Absolute 0.0 0.0 - 0.1 K/uL   Immature Granulocytes 1 %   Abs Immature Granulocytes 0.06 0.00 - 0.07 K/uL  Ethanol  Result Value Ref Range   Alcohol, Ethyl (B) 256 (H) <10 mg/dL  Urine rapid drug screen (hosp performed)  Result Value Ref Range   Opiates NONE DETECTED NONE DETECTED   Cocaine NONE DETECTED NONE DETECTED   Benzodiazepines NONE DETECTED NONE DETECTED   Amphetamines NONE DETECTED NONE DETECTED   Tetrahydrocannabinol NONE DETECTED NONE DETECTED   Barbiturates NONE DETECTED NONE DETECTED  I-Stat CG4 Lactic Acid, ED  Result Value Ref Range   Lactic Acid, Venous 2.99 (HH) 0.5 - 1.9 mmol/L   Comment NOTIFIED PHYSICIAN   I-Stat CG4 Lactic Acid, ED  Result Value Ref Range   Lactic Acid, Venous 2.36 (HH) 0.5 - 1.9 mmol/L   Comment NOTIFIED PHYSICIAN    Ct Head Wo Contrast  Result Date: 08/05/2018 CLINICAL DATA:  Acute onset of unresponsiveness. EXAM: CT HEAD WITHOUT CONTRAST TECHNIQUE: Contiguous axial images were obtained from the base of the skull through the vertex without intravenous contrast. COMPARISON:  None. FINDINGS: Brain: No evidence of acute infarction, hemorrhage, hydrocephalus, extra-axial collection or mass lesion/mass effect. Postoperative change is noted at the left frontal lobe, with associated encephalomalacia. Mild cerebellar atrophy is noted. The brainstem and fourth ventricle are within normal limits. The third and lateral ventricles, and basal ganglia are unremarkable in appearance. The cerebral hemispheres are symmetric in appearance, with normal gray-white differentiation. No mass effect or midline shift is seen. Vascular: No hyperdense vessel or unexpected calcification. Skull: There is no evidence of fracture; visualized osseous structures are unremarkable in appearance. Sinuses/Orbits: The  visualized portions of the orbits are within normal limits. The paranasal sinuses and mastoid air cells are well-aerated. Other: No significant soft tissue abnormalities are seen. IMPRESSION: 1. No acute intracranial pathology seen on CT. 2. Postoperative change at the left frontal lobe, with associated encephalomalacia. Electronically Signed   By: Roanna Raider M.D.   On: 08/05/2018 23:53      Dione Booze, MD 08/06/18 [phone]
